# Patient Record
Sex: Female | Born: 1950 | Race: White | Hispanic: No | Marital: Married | Smoking: Current every day smoker
Health system: Southern US, Community
[De-identification: ages and names within clinical notes are randomized; demographics above are authoritative.]

## PROBLEM LIST (undated history)

## (undated) DIAGNOSIS — I10 Essential (primary) hypertension: Secondary | ICD-10-CM

## (undated) DIAGNOSIS — E7800 Pure hypercholesterolemia, unspecified: Secondary | ICD-10-CM

## (undated) DIAGNOSIS — A0472 Enterocolitis due to Clostridium difficile, not specified as recurrent: Secondary | ICD-10-CM

## (undated) DIAGNOSIS — C801 Malignant (primary) neoplasm, unspecified: Secondary | ICD-10-CM

## (undated) HISTORY — PX: ABDOMINAL HYSTERECTOMY: SHX81

## (undated) HISTORY — PX: DILATION AND CURETTAGE OF UTERUS: SHX78

## (undated) HISTORY — DX: Enterocolitis due to Clostridium difficile, not specified as recurrent: A04.72

## (undated) HISTORY — PX: CHOLECYSTECTOMY: SHX55

---

## 2007-10-28 ENCOUNTER — Emergency Department (HOSPITAL_COMMUNITY): Admission: EM | Admit: 2007-10-28 | Discharge: 2007-10-28 | Payer: Self-pay | Admitting: Emergency Medicine

## 2011-04-30 LAB — DIFFERENTIAL
Basophils Relative: 0
Eosinophils Absolute: 0.1
Lymphocytes Relative: 15
Monocytes Relative: 5
Neutro Abs: 9.5 — ABNORMAL HIGH
Neutrophils Relative %: 80 — ABNORMAL HIGH

## 2011-04-30 LAB — COMPREHENSIVE METABOLIC PANEL
ALT: 18
Alkaline Phosphatase: 69
CO2: 25
Chloride: 103
GFR calc non Af Amer: >60
Glucose, Bld: 110 — ABNORMAL HIGH
Potassium: 3.9
Sodium: 136
Total Bilirubin: 0.7
Total Protein: 6.6

## 2011-04-30 LAB — URINALYSIS, ROUTINE W REFLEX MICROSCOPIC
Glucose, UA: NEGATIVE
Nitrite: NEGATIVE
Specific Gravity, Urine: 1.01
pH: 7

## 2011-04-30 LAB — CBC
Hemoglobin: 13.6
RBC: 4.11
WBC: 11.8 — ABNORMAL HIGH

## 2012-10-14 ENCOUNTER — Encounter (HOSPITAL_COMMUNITY): Payer: Self-pay | Admitting: Emergency Medicine

## 2012-10-14 ENCOUNTER — Emergency Department (HOSPITAL_COMMUNITY)
Admission: EM | Admit: 2012-10-14 | Discharge: 2012-10-14 | Disposition: A | Discharge status: Home / Self Care | Payer: Commercial Managed Care - PPO | Attending: Emergency Medicine | Admitting: Emergency Medicine

## 2012-10-14 DIAGNOSIS — K649 Unspecified hemorrhoids: Secondary | ICD-10-CM | POA: Insufficient documentation

## 2012-10-14 DIAGNOSIS — F17200 Nicotine dependence, unspecified, uncomplicated: Secondary | ICD-10-CM | POA: Insufficient documentation

## 2012-10-14 DIAGNOSIS — Z862 Personal history of diseases of the blood and blood-forming organs and certain disorders involving the immune mechanism: Secondary | ICD-10-CM | POA: Insufficient documentation

## 2012-10-14 DIAGNOSIS — Z79899 Other long term (current) drug therapy: Secondary | ICD-10-CM | POA: Insufficient documentation

## 2012-10-14 DIAGNOSIS — I10 Essential (primary) hypertension: Secondary | ICD-10-CM | POA: Insufficient documentation

## 2012-10-14 DIAGNOSIS — K5900 Constipation, unspecified: Secondary | ICD-10-CM | POA: Insufficient documentation

## 2012-10-14 DIAGNOSIS — K921 Melena: Secondary | ICD-10-CM | POA: Insufficient documentation

## 2012-10-14 DIAGNOSIS — Z8639 Personal history of other endocrine, nutritional and metabolic disease: Secondary | ICD-10-CM | POA: Insufficient documentation

## 2012-10-14 DIAGNOSIS — K625 Hemorrhage of anus and rectum: Secondary | ICD-10-CM | POA: Insufficient documentation

## 2012-10-14 HISTORY — DX: Pure hypercholesterolemia, unspecified: E78.00

## 2012-10-14 HISTORY — DX: Essential (primary) hypertension: I10

## 2012-10-14 LAB — CBC WITH DIFFERENTIAL/PLATELET
Basophils Absolute: 0 10*3/uL (ref 0.0–0.1)
Basophils Relative: 1 % (ref 0–1)
Hemoglobin: 14.4 g/dL (ref 12.0–15.0)
MCHC: 34.4 g/dL (ref 30.0–36.0)
Neutro Abs: 5.7 10*3/uL (ref 1.7–7.7)
Neutrophils Relative %: 76 % (ref 43–77)
RDW: 13.3 % (ref 11.5–15.5)

## 2012-10-14 LAB — BASIC METABOLIC PANEL
BUN: 5 mg/dL — ABNORMAL LOW (ref 6–23)
GFR calc Af Amer: >90 mL/min (ref >90)
GFR calc non Af Amer: >90 mL/min (ref >90)
Potassium: 3.6 mEq/L (ref 3.5–5.1)

## 2012-10-14 LAB — OCCULT BLOOD, POC DEVICE: Fecal Occult Bld: POSITIVE — AB

## 2012-10-14 MED ORDER — POLYETHYLENE GLYCOL 3350 17 G PO PACK: 17.0000 g | PACK | Freq: Every day | ORAL | Status: DC

## 2012-10-14 MED ORDER — HYDROCORTISONE 2.5 % RE CREA: TOPICAL_CREAM | RECTAL | Status: DC

## 2012-10-14 NOTE — ED Provider Notes (Signed)
History  This chart was scribed for Joya Gaskins, MD by Bennett Scrape, ED Scribe. This patient was seen in room APA18/[facility]18 and the patient's care was started at 10:34 AM.  CSN: [id number]  Arrival date & time 10/14/12  1026   First MD Initiated Contact with Patient 10/14/12 1034      Chief Complaint  Patient presents with  . Rectal Bleeding     Patient is a 62 y.o. female presenting with hematochezia. The history is provided by the patient. No language interpreter was used.  Rectal Bleeding  The current episode started yesterday. The onset was sudden. The problem occurs continuously. The problem has been unchanged. The patient is experiencing no pain. Pertinent negatives include no fever, no abdominal pain, no nausea, no vomiting, no vaginal bleeding and no coughing.    Sierra Roberts is a 62 y.o. female who presents to the Emergency Department complaining of sudden onset, non-changing, constant bright red rectal bleeding that started yesterday during a BM. Pt reports that her BMs go from watery to constipated. Her last BMs was yesterday and she denies excessive straining. She reports prior episodes contributed to hemorrhoids. She denies having a h/o GI bleed and denies being on any anticoagulants currently. She denies following up with a GI currently. She denies cough, SOB, abdominal pain and emesis as associated symptoms. She has a h/o HTN which she takes daily medications for. She is a current everyday smoker and occasional alcohol user.  PCP is with [facility].  Past Medical History  Diagnosis Date  . Hypertension   . High cholesterol     Past Surgical History  Procedure Laterality Date  . Cholecystectomy      No family history on file.  History  Substance Use Topics  . Smoking status: Current Every Day Smoker -- 0.50 packs/day    Types: Cigarettes  . Smokeless tobacco: Not on file  . Alcohol Use: 1.2 oz/week    2 Glasses of wine per week     Comment: daily     No OB history provided.  Review of Systems  Constitutional: Negative for fever and chills.  Respiratory: Negative for cough and shortness of breath.   Gastrointestinal: Positive for hematochezia and anal bleeding. Negative for nausea, vomiting and abdominal pain.  Genitourinary: Negative for vaginal bleeding.  Musculoskeletal: Negative for back pain.  All other systems reviewed and are negative.    Allergies  Clams  Home Medications  No current outpatient prescriptions on file.  Triage Vitals: BP 146/94  Pulse 89  Temp(Src) 98.3 F (36.8 C)  Resp 18  Ht 5\' 6"  (1.676 m)  Wt 203 lb (92.08 kg)  BMI 32.78 kg/m2  SpO2 97%  Physical Exam  Nursing note and vitals reviewed.  CONSTITUTIONAL: Well developed/well nourished HEAD: Normocephalic/atraumatic EYES: EOMI/PERRL ENMT: Mucous membranes moist NECK: supple no meningeal signs SPINE:entire spine nontender CV: S1/S2 noted, no murmurs/rubs/gallops noted LUNGS: Lungs are clear to auscultation bilaterally, no apparent distress ABDOMEN: soft, nontender, no rebound or guarding GU:no cva tenderness RECTAL: Small amount of gross blood present on exam, stool is normal in color, no melena, several hemorrhoids noted, chaperone present NEURO: Pt is awake/alert, moves all extremitiesx4 EXTREMITIES: pulses normal, full ROM SKIN: warm, color normal PSYCH: no abnormalities of mood noted  ED Course  Procedures (including critical care time)  DIAGNOSTIC STUDIES: Oxygen Saturation is 97% on room air, adequate by my interpretation.    COORDINATION OF CARE: 10:50 AM-Discussed treatment plan which includes CBC panel  with pt at bedside and pt agreed to plan.    12:35 PM Spoke to on call GI dr Jena Gauss Low suspicion for serious GI bleed, especially since presence of hemorrhoids, normal HGB and otherwise well Recommends miralax (for recent constipation) anusol and will see her in clinic this week  Pt reports she had BM while in the ED  and no blood noted She feels comfortable with plan.  We discussed strict return precautions  MDM  Nursing notes including past medical history and social history reviewed and considered in documentation Labs/vital reviewed and considered    I personally performed the services described in this documentation, which was scribed in my presence. The recorded information has been reviewed and is accurate.   Joya Gaskins, MD 10/14/12 [phone]

## 2012-10-14 NOTE — ED Notes (Signed)
POC occult blood completed with a postive result. EDP aware. No new orders at this time.

## 2012-10-14 NOTE — ED Notes (Signed)
Pt c/o bright red rectal bleeding since yesterday. States she has some hemorrhoids.  Denies dizziness.

## 2012-10-16 ENCOUNTER — Encounter: Payer: Self-pay | Admitting: Gastroenterology

## 2012-10-16 ENCOUNTER — Ambulatory Visit (INDEPENDENT_AMBULATORY_CARE_PROVIDER_SITE_OTHER): Payer: Commercial Managed Care - PPO | Admitting: Gastroenterology

## 2012-10-16 VITALS — BP 124/80 | HR 97 | Temp 97.5°F | Ht 66.0 in | Wt 201.8 lb

## 2012-10-16 DIAGNOSIS — K625 Hemorrhage of anus and rectum: Secondary | ICD-10-CM | POA: Insufficient documentation

## 2012-10-16 NOTE — Progress Notes (Signed)
Faxed to PCP

## 2012-10-16 NOTE — Assessment & Plan Note (Signed)
62 year old female with recent 24 hour episode of rectal bleeding, resolving spontaneously. Hgb stable several days ago when checked in the ED. She has never had a lower GI evaluation, and she is adamantly refusing further work-up at this time. She denies any changes in bowel habits, abdominal pain, or any upper GI symptoms. I have discussed in detail the need for colorectal cancer screening. She understands the rationale for evaluation, but she is again refusing any colonoscopy at this point. I discussed future signs and symptoms that would warrant further investigation. She is fully aware that there is no way we can 100% know that the recent incidence was or was not related to hemorrhoids. She understands this as well.   Follow-up prn.  Colon cancer handout provided.

## 2012-10-16 NOTE — Progress Notes (Signed)
Primary Care Physician:  Lenise Herald, PA-C Primary Gastroenterologist:  Dr. Jena Gauss   Chief Complaint  Patient presents with  . Follow-up    HPI:   Ms. Sierra Roberts is a pleasant 62 year old female who presents today at the request of the ED secondary to rectal bleeding. Her Hgb was normal at that time, and rectal bleeding resolved while in the ED.  History of hematochezia in the past, intermittently. Recent episode of 24 hours of rectal bleeding, ceased while in ED. Started on Anusol cream. Prescribed Miralax as well. Denies chronic constipation, but she does have episodes occasionally of feeling constipated. Will sometimes have bowel movements with large amounts of liquid, which is more often than constipation. Will have nickel-quarter sized chunks in water, once a day occasionally. No abdominal pain. No rectal discomfort but occasional itching. No FH of colon cancer. NO prior colonoscopy. Lost about 25 lbs in the last year and a half because she "can't eat like she did when she was 20". She has been trying to eat healthier due to medical issues to include hypertension and high cholesterol. No upper GI symptoms.   She is ADAMANTLY refusing a colonoscopy.   Past Medical History  Diagnosis Date  . Hypertension   . High cholesterol     Past Surgical History  Procedure Laterality Date  . Cholecystectomy      Current Outpatient Prescriptions  Medication Sig Dispense Refill  . acidophilus (RISAQUAD) CAPS Take 1 capsule by mouth daily.      Marland Kitchen lisinopril-hydrochlorothiazide (PRINZIDE,ZESTORETIC) 20-12.5 MG per tablet Take 1 tablet by mouth daily.      . Multiple Vitamins-Minerals (MULTIVITAMINS THER. W/MINERALS) TABS Take 1 tablet by mouth daily.      . polyethylene glycol (MIRALAX / GLYCOLAX) packet Take 17 g by mouth daily.  10 each  0  . hydrocortisone (ANUSOL-HC) 2.5 % rectal cream Apply rectally 2 times daily  30 g  0   No current facility-administered medications for this visit.     Allergies as of 10/16/2012 - Review Complete 10/16/2012  Allergen Reaction Noted  . Clams (shellfish allergy)  10/14/2012    Family History  Problem Relation Age of Onset  . Colon cancer Neg Hx     History   Social History  . Marital Status: Married    Spouse Name: N/A    Number of Children: 1  . Years of Education: N/A   Occupational History  . housewife    Social History Main Topics  . Smoking status: Current Every Day Smoker -- 0.50 packs/day    Types: Cigarettes  . Smokeless tobacco: Not on file     Comment: now down to less than 1/2 a pack  . Alcohol Use: 1.2 oz/week    2 Glasses of wine per week     Comment: daily  . Drug Use: No  . Sexually Active: Not on file   Other Topics Concern  . Not on file   Social History Narrative  . No narrative on file    Review of Systems: Gen: Denies any fever, chills, fatigue, weight loss, lack of appetite.  CV: Denies chest pain, heart palpitations, peripheral edema, syncope.  Resp: Denies shortness of breath at rest or with exertion. Denies wheezing or cough.  GI: SEE HPI GU : Denies urinary burning, urinary frequency, urinary hesitancy MS: Denies joint pain, muscle weakness, cramps, or limitation of movement.  Derm: Denies rash, itching, dry skin Psych: Denies depression, anxiety, memory loss, and confusion Heme: Denies  bruising, bleeding, and enlarged lymph nodes.  Physical Exam: BP 124/80  Pulse 97  Temp(Src) 97.5 F (36.4 C) (Oral)  Ht 5\' 6"  (1.676 m)  Wt 201 lb 12.8 oz (91.536 kg)  BMI 32.59 kg/m2 General:   Alert and oriented. Pleasant and cooperative. Well-nourished and well-developed.  Head:  Normocephalic and atraumatic. Eyes:  Without icterus, sclera clear and conjunctiva pink.  Ears:  Normal auditory acuity. Nose:  No deformity, discharge,  or lesions. Mouth:  No deformity or lesions, oral mucosa pink.  Neck:  Supple, without mass or thyromegaly. Lungs:  Clear to auscultation bilaterally. No  wheezes, rales, or rhonchi. No distress.  Heart:  S1, S2 present without murmurs appreciated.  Abdomen:  +BS, soft, non-tender and non-distended. No HSM noted. No guarding or rebound. No masses appreciated.  Rectal: REFUSED Msk:  Symmetrical without gross deformities. Normal posture. Pulses:  Normal pulses noted. Extremities:  Without clubbing or edema. Neurologic:  Alert and  oriented x4;  grossly normal neurologically. Skin:  Intact without significant lesions or rashes. Cervical Nodes:  No significant cervical adenopathy. Psych:  Alert and cooperative. Normal mood and affect.

## 2012-10-16 NOTE — Patient Instructions (Addendum)
Continue to eat a high fiber diet.  Monitor for any signs of rectal bleeding, change in bowel habits, abdominal pain, unintentional weight loss, change in caliber of stools.  We recommend a colonoscopy, so please let us know if you are willing to proceed in the future.  Otherwise, we will see you back as needed.   Colorectal Cancer Colorectal cancer is an abnormal growth of tissue (tumor) in the colon or rectum that is cancerous (malignant). Unlike noncancerous (benign) tumors, malignant tumors can spread to other parts of your body. The colon is the large bowel or large intestine. The rectum is the last several inches of the colon. CAUSES  The exact cause of colon cancer is unknown.  RISK FACTORS The majority of patients do not have identifiable risk factors, but the following factors may increase your chances of getting colon cancer:  Age. Most colorectal cancers occur in people older than 50 years.  Having abnormal growths (polyps) on the inner wall of the colon or rectum.  Diabetes.  Being African American.  Family history of hereditary nonpolyposis colon cancer. This condition is caused by changes in the genes that are responsible for repairing mismatched DNA.  Family history of familial adenomatous polyposis (FAP). This is a rare, inherited condition in which hundreds of polyps form in the colon and rectum. It is caused by a change in the APC gene. Unless FAP is treated, it usually leads to colorectal cancer by age 13.  Personal history of cancer. A person who has already had colorectal cancer may develop it a second time. Also, women with a history of ovarian, uterine, or breast cancer are at a somewhat higher risk of developing colorectal cancer.  Inflammatory bowel disease, including ulcerative colitis and Crohn's disease.  Being obese or eating a diet that is high in fat (especially animal fat) and low in fiber, fruits, and vegetables.  Smoking. A person who smokes  cigarettes may be at increased risk of developing polyps and colorectal cancer.  Heavy alcohol use. SYMPTOMS Early colorectal cancer often does not cause symptoms. As the cancer grows, symptoms may include:  Diarrhea.  Constipation.  Feeling like the bowel does not empty completely after a bowel movement.  Blood in the stool.  Stools that are narrower than usual.  Abdominal discomfort, pain, bloating, fullness, or cramps.  Unexplained weight loss.  Constant tiredness.  Nausea and vomiting. DIAGNOSIS  Your caregiver will ask about your medical history. He or she may also perform a number of procedures, such as:  A physical exam.  Blood tests. This may include a routine complete blood count and iron level testing. Your caregiver may also check for carcinoembryonic antigen (CEA) and other substances in the blood. Some people who have colorectal cancer have a high CEA level. These levels may be used to follow the activity of your colon cancer.  Chest X-rays, computed tomography (CT) scans, or magnetic resonance imaging (MRI).  Taking a tissue sample (biopsy) from the colon or rectum. The sample is examined under a microscope to look for cancer cells.  Sigmoidoscopy. With this test, your caregiver can see inside your colon. A thin flexible tube (sigmoidoscope) is placed into your rectum. This device has a light source and a tiny video camera in it. Your caregiver uses the sigmoidoscope to look at the last third of your colon.  Colonoscopy. This test is like sigmoidoscopy, but your caregiver looks at the entire colon. This test usually requires medicine that helps you relax (sedative).  Endorectal ultrasound. With this test, your caregiver can see how deep a rectal tumor has grown and whether the cancer has spread to lymph nodes or other nearby tissues. A tool (probe) is inserted into the rectum. The probe sends out sound waves to the rectum and nearby tissues, and a computer uses the  echoes to create a picture. Your cancer will be staged to determine its severity and extent. Staging is a careful attempt to find out the size of the tumor, whether the cancer has spread, and if so, to what parts of the body. You may need to have more tests to determine the stage of your cancer. The test results will help determine what treatment plan is best for you. STAGES  Stage 0. The cancer is found only in the innermost lining of the colon or rectum.  Stage I. The cancer has grown into the inner wall of the colon or rectum. The cancer has not yet reached the outer wall of the colon.  Stage II. The cancer extends more deeply into or through the wall of the colon or rectum. It may have invaded nearby tissue, but cancer cells have not spread to the lymph nodes.  Stage III. The cancer has spread to nearby lymph nodes but not to other parts of the body.  Stage IV. The cancer has spread to other parts of the body, such as the liver or lungs. Your caregiver may tell you the detailed stage of your cancer. In that case, the stage will include both a number and a letter. TREATMENT  Depending on the type and stage, colorectal cancer may be treated with surgery, radiation therapy, or chemotherapy. Some patients have a combination of these therapies.  Surgery may be done to remove the polyps from your colon. In early stages, your caregiver may be able to do this during a colonoscopy. In later stages, surgery may be done to remove part of your colon.  Radiation therapy uses high-energy rays to kill cancer cells. This is usually recommended for patients with rectal cancer.  Chemotherapy is the use of drugs to kill cancer cells. Caregivers also give chemotherapy to help reduce pain and other problems caused by colorectal cancer. This may be done even if the cancer is not curable. HOME CARE INSTRUCTIONS   Only take over-the-counter or prescription medicines for pain, discomfort, or fever as directed by  your caregiver.  Maintain a healthy diet.  Consider joining a support group. This may help you learn to cope with the stress of having colorectal cancer.  Seek advice to help you manage treatment side effects.  Keep all follow-up appointments as directed by your caregiver.  Inform your cancer specialist if you are admitted to the hospital. SEEK IMMEDIATE MEDICAL CARE IF:   Your diarrhea or constipation does not go away.  You have alternating constipation and diarrhea.  You have blood in your stools.  Your abdominal pain gets worse.  You lose weight without trying.  You notice new fatigue or weakness.  You develop a fever during chemotherapy treatment. Document Released: 07/23/2005 Document Revised: 10/15/2011 Document Reviewed: 07/10/2011 [facility] Patient Information 2013 [place], [place].

## 2012-11-06 ENCOUNTER — Telehealth: Payer: Self-pay

## 2012-11-06 NOTE — Telephone Encounter (Signed)
Let's go ahead and get a GI pathogen stool report on her.   Have her follow-up with me next available in next 1-2 weeks(not urgent, this will give [place] time to get the labs back). Low-residue diet for now. She refused a colonoscopy at last visit. Hopefully, she will decide to proceed and may need an EGD if she continues to lose weight, +loss of appetite.

## 2012-11-06 NOTE — Telephone Encounter (Signed)
Pt called and said that she was having problems with loss of appetite. Loose stools x 3 days. ( She asked if Danley Danker had sent referral for a colonoscopy, she saw him yesterday). We had not received referral. But I told her since she was seen here by Gerrit Halls, NP on 10/16/2012. I will ask her for recommendations. She can be reached at [phone]. ( She said she did have a little chicken rice soup yesterday. Then she felt rotten today. Please advise!

## 2012-11-07 NOTE — Telephone Encounter (Signed)
Called and informed pt. She will come to the office to pick up specimen bottle and diet.

## 2012-11-17 ENCOUNTER — Telehealth: Payer: Self-pay | Admitting: Gastroenterology

## 2012-11-17 MED ORDER — METRONIDAZOLE 500 MG PO TABS: 500.0000 mg | ORAL_TABLET | Freq: Three times a day (TID) | ORAL | Status: DC

## 2012-11-17 NOTE — Telephone Encounter (Signed)
Pt aware, she has not been on any recent abx or around anyone that is sick that she is aware of. Pt is scheduled for ov on Wednesday of this week, per AS she doesn't need this appt and we need to be rescheduled for 2 weeks. Darl Pikes to reschedule ov appt.

## 2012-11-17 NOTE — Telephone Encounter (Signed)
I received GI pathogen stool studies back.  Appears positive for Cdiff.   Has she had any exposure to antibiotics recently? Any sick contacts? I have sent in Flagyl. Needs to take total of 14 days. Follow-up with [place] after completion of antibiotics for reassessment. Ultimately needs colonoscopy after current issues resolve. Continue probiotic she is taking.

## 2012-11-19 ENCOUNTER — Ambulatory Visit: Payer: Commercial Managed Care - PPO | Admitting: Gastroenterology

## 2012-11-26 ENCOUNTER — Other Ambulatory Visit: Payer: Self-pay

## 2012-11-26 ENCOUNTER — Telehealth: Payer: Self-pay | Admitting: General Practice

## 2012-11-26 ENCOUNTER — Other Ambulatory Visit: Payer: Self-pay | Admitting: Gastroenterology

## 2012-11-26 DIAGNOSIS — R197 Diarrhea, unspecified: Secondary | ICD-10-CM

## 2012-11-26 NOTE — Telephone Encounter (Signed)
Spoke with pt- she started having tingling in her hand and foot today, no SOB, no rashes, no dizzines. Pt has been taking abx since 4/14 and her diarrhea doesn't seem to be getting any better. She still has around 2 bowel movements daily and they are very loose and runny. She doesn't think the abx are working and stated she wasn't taking anymore of this abx. Please advise.

## 2012-11-26 NOTE — Telephone Encounter (Signed)
This pleasant lady is aware of her OV on 4/30 and she has an appt card.

## 2012-11-26 NOTE — Telephone Encounter (Signed)
Pt aware. Stool container at the front desk. Pt is agreeable to ov to set up tcs.  Darl Pikes, please schedule.

## 2012-11-26 NOTE — Telephone Encounter (Signed)
Patient called and stated she is having tingling in her left foot/left hand.  She started having the tingling once she started the antibiotic. She read on the [facility] web site that if you start having these symptoms to stop taking this medication and call your doctor immediatly.  Please advise?

## 2012-11-26 NOTE — Telephone Encounter (Signed)
Recheck Cdiff.   Unclear if this symptom is related to Flagyl or not. Needs to come in for OV to set up TCS.

## 2012-12-03 ENCOUNTER — Ambulatory Visit (INDEPENDENT_AMBULATORY_CARE_PROVIDER_SITE_OTHER): Payer: Commercial Managed Care - PPO | Admitting: Gastroenterology

## 2012-12-03 ENCOUNTER — Telehealth: Payer: Self-pay | Admitting: Internal Medicine

## 2012-12-03 ENCOUNTER — Encounter: Payer: Self-pay | Admitting: Gastroenterology

## 2012-12-03 VITALS — BP 141/87 | HR 87 | Temp 98.2°F | Ht 66.0 in | Wt 196.2 lb

## 2012-12-03 DIAGNOSIS — A0472 Enterocolitis due to Clostridium difficile, not specified as recurrent: Secondary | ICD-10-CM

## 2012-12-03 DIAGNOSIS — Z1211 Encounter for screening for malignant neoplasm of colon: Secondary | ICD-10-CM

## 2012-12-03 LAB — CLOSTRIDIUM DIFFICILE BY PCR: Toxigenic C. Difficile by PCR: NOT DETECTED

## 2012-12-03 MED ORDER — PEG 3350-KCL-NA BICARB-NACL 420 G PO SOLR: 4000.0000 mL | ORAL | Status: DC

## 2012-12-03 NOTE — Progress Notes (Signed)
Referring Provider: Mann, Benjamin, PA-C Primary Care Physician:  MANN, BENJAMIN, PA-C Primary Gastroenterologist: Dr. Rourk   Chief Complaint  Patient presents with  . Follow-up    HPI:   62-year-old female presentss today, previously seen by myself in March 2014 due to rectal bleeding with normal Hgb. She refused colonoscopy at that time. She called in early April with loose stools, and Cdiff performed was POSITIVE mid April. Started on Flagyl.  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 Now notes 1 loose/runny BM per day; she will also have occasional constipation, which causes significantly decreased appetite, bloating, and unable to eat until constipation resolved. No rectal bleeding.   She has lost 7 lbs since March 2014.   Past Medical History  Diagnosis Date  . Hypertension   . High cholesterol   . C. difficile diarrhea     Past Surgical History  Procedure Laterality Date  . Cholecystectomy      Current Outpatient Prescriptions  Medication Sig Dispense Refill  . acidophilus (RISAQUAD) CAPS Take 1 capsule by mouth daily.      . hydrocortisone (ANUSOL-HC) 2.5 % rectal cream Apply rectally 2 times daily  30 g  0  . lisinopril-hydrochlorothiazide (PRINZIDE,ZESTORETIC) 20-12.5 MG per tablet Take 1 tablet by mouth daily.      . Multiple Vitamins-Minerals (MULTIVITAMINS THER. W/MINERALS) TABS Take 1 tablet by mouth daily.      . polyethylene glycol (MIRALAX / GLYCOLAX) packet Take 17 g by mouth daily.  10 each  0  . metroNIDAZOLE (FLAGYL) 500 MG tablet Take 1 tablet (500 mg total) by mouth 3 (three) times daily.  42 tablet  0  . polyethylene glycol-electrolytes (TRILYTE) 420 G solution Take 4,000 mLs by mouth as directed.  4000 mL  0   No current facility-administered medications for this visit.    Allergies as of 12/03/2012 - Review Complete 12/03/2012  Allergen Reaction Noted  . Clams (shellfish allergy)  10/14/2012  .  Flagyl (metronidazole) Other (See Comments) 12/03/2012    Family History  Problem Relation Age of Onset  . Colon cancer Neg Hx     History   Social History  . Marital Status: Married    Spouse Name: N/A    Number of Children: 1  . Years of Education: N/A   Occupational History  . housewife    Social History Main Topics  . Smoking status: Current Every Day Smoker -- 0.50 packs/day    Types: Cigarettes  . Smokeless tobacco: None     Comment: now down to less than 1/2 a pack  . Alcohol Use: 1.2 oz/week    2 Glasses of wine per week     Comment: daily  . Drug Use: No  . Sexually Active: None   Other Topics Concern  . None   Social History Narrative  . None    Review of Systems: Negative unless mentioned in HPI.   Physical Exam: BP 141/87  Pulse 87  Temp(Src) 98.2 F (36.8 C) (Oral)  Ht 5' 6" (1.676 m)  Wt 196 lb 3.2 oz (88.996 kg)  BMI 31.68 kg/m2 General:   Alert and oriented. No distress noted. Pleasant and cooperative.  Head:  Normocephalic and atraumatic. Eyes:  Conjuctiva clear without scleral icterus. Mouth:  Oral mucosa pink and moist. Good dentition. No lesions. Heart:  S1, S2 present without murmurs, rubs, or gallops. Regular rate and rhythm. Abdomen:  +BS, soft, non-tender and   non-distended. No rebound or guarding. No HSM or masses noted. Possible ventral hernia at RUQ incision Msk:  Symmetrical without gross deformities. Normal posture. Extremities:  Without edema. Neurologic:  Alert and  oriented x4;  grossly normal neurologically. Skin:  Intact without significant lesions or rashes. Psych:  Alert and cooperative. Normal mood and affect.  

## 2012-12-03 NOTE — Telephone Encounter (Signed)
DONE

## 2012-12-03 NOTE — Telephone Encounter (Signed)
Pt called to [facility] her TCS. I told her that I would have LAW call her back as soon as she gets in. [phone]

## 2012-12-03 NOTE — Patient Instructions (Addendum)
We have scheduled you for a colonoscopy with Dr. Jena Gauss in the near future.  If the Cdiff returns positive, we will need to postpone this. We will be in touch shortly.

## 2012-12-05 ENCOUNTER — Encounter: Payer: Self-pay | Admitting: Gastroenterology

## 2012-12-05 DIAGNOSIS — Z1211 Encounter for screening for malignant neoplasm of colon: Secondary | ICD-10-CM | POA: Insufficient documentation

## 2012-12-05 NOTE — Assessment & Plan Note (Signed)
62 year old female with no prior colonoscopy, originally referred in March 2014 due to limited rectal bleeding. She refused colonoscopy at that time, with subsequent development of diarrhea and +CDIFF through our office. She completed 9 days of Flagyl, after stopping due to possible neuropathy side effects. She notes intermittent constipation with associated bloating, loss of appetite. Otherwise, her bowel movements are normally loose/runny, once daily. It is concerning that she has lost 7 lbs, unintentionally, since March 2014. She is now agreeable to proceed with a colonoscopy and actually eager to do this.  Recheck Cdiff PCR now to ensure resolution Proceed with TCS with Dr. Jena Gauss in near future: the risks, benefits, and alternatives have been discussed with the patient in detail. The patient states understanding and desires to proceed.

## 2012-12-08 NOTE — Progress Notes (Signed)
Cc PCP 

## 2012-12-11 NOTE — Progress Notes (Signed)
Quick Note:  Negative Cdiff. Proceed with colonoscopy as planned. ______

## 2012-12-15 ENCOUNTER — Encounter (HOSPITAL_COMMUNITY): Payer: Self-pay | Admitting: Pharmacy Technician

## 2012-12-30 ENCOUNTER — Encounter (HOSPITAL_COMMUNITY)
Admission: RE | Disposition: A | Discharge status: Home / Self Care | Payer: Self-pay | Source: Ambulatory Visit | Attending: Internal Medicine

## 2012-12-30 ENCOUNTER — Encounter (HOSPITAL_COMMUNITY): Payer: Self-pay | Admitting: *Deleted

## 2012-12-30 ENCOUNTER — Ambulatory Visit (HOSPITAL_COMMUNITY)
Admission: RE | Admit: 2012-12-30 | Discharge: 2012-12-30 | Disposition: A | Discharge status: Home / Self Care | Payer: Commercial Managed Care - PPO | Source: Ambulatory Visit | Attending: Internal Medicine | Admitting: Internal Medicine

## 2012-12-30 DIAGNOSIS — A0472 Enterocolitis due to Clostridium difficile, not specified as recurrent: Secondary | ICD-10-CM

## 2012-12-30 DIAGNOSIS — D126 Benign neoplasm of colon, unspecified: Secondary | ICD-10-CM

## 2012-12-30 DIAGNOSIS — K5730 Diverticulosis of large intestine without perforation or abscess without bleeding: Secondary | ICD-10-CM | POA: Insufficient documentation

## 2012-12-30 DIAGNOSIS — I10 Essential (primary) hypertension: Secondary | ICD-10-CM | POA: Insufficient documentation

## 2012-12-30 DIAGNOSIS — K648 Other hemorrhoids: Secondary | ICD-10-CM

## 2012-12-30 DIAGNOSIS — K625 Hemorrhage of anus and rectum: Secondary | ICD-10-CM

## 2012-12-30 HISTORY — PX: COLONOSCOPY: SHX5424

## 2012-12-30 SURGERY — COLONOSCOPY
Anesthesia: Moderate Sedation

## 2012-12-30 MED ORDER — MIDAZOLAM HCL 5 MG/5ML IJ SOLN
INTRAMUSCULAR | Status: DC | PRN
  Administered 2012-12-30 (×3): 2 mg via INTRAVENOUS

## 2012-12-30 MED ORDER — MEPERIDINE HCL 100 MG/ML IJ SOLN
INTRAMUSCULAR | Status: AC
  Filled 2012-12-30: qty 1

## 2012-12-30 MED ORDER — STERILE WATER FOR IRRIGATION IR SOLN
Status: DC | PRN
  Administered 2012-12-30: 09:00:00

## 2012-12-30 MED ORDER — SODIUM CHLORIDE 0.9 % IV SOLN
INTRAVENOUS | Status: DC
  Administered 2012-12-30: 09:00:00 via INTRAVENOUS

## 2012-12-30 MED ORDER — MEPERIDINE HCL 100 MG/ML IJ SOLN
INTRAMUSCULAR | Status: DC | PRN
  Administered 2012-12-30: 50 mg via INTRAVENOUS
  Administered 2012-12-30: 25 mg via INTRAVENOUS
  Administered 2012-12-30: 50 mg via INTRAVENOUS

## 2012-12-30 MED ORDER — ONDANSETRON HCL 4 MG/2ML IJ SOLN
INTRAMUSCULAR | Status: AC
  Filled 2012-12-30: qty 2

## 2012-12-30 MED ORDER — MIDAZOLAM HCL 5 MG/5ML IJ SOLN
INTRAMUSCULAR | Status: AC
  Filled 2012-12-30: qty 10

## 2012-12-30 NOTE — Op Note (Signed)
[facility] [address] [place], [address]   COLONOSCOPY PROCEDURE REPORT  PATIENT: Sierra Roberts, Sierra Roberts  MR#:         [id number] BIRTHDATE: 1950/09/04 , 61  yrs. old GENDER: Female ENDOSCOPIST: R.  Roetta Sessions, MD FACP [facility] REFERRED BY:  Lenise Herald, PA-C PROCEDURE DATE:  12/30/2012 PROCEDURE:     Ileocolonoscopy with snare polypectomy  INDICATIONS: rectal bleeding; no prior colonoscopy  INFORMED CONSENT:  The risks, benefits, alternatives and imponderables including but not limited to bleeding, perforation as well as the possibility of a missed lesion have been reviewed.  The potential for biopsy, lesion removal, etc. have also been discussed.  Questions have been answered.  All parties agreeable. Please see the history and physical in the medical record for more information.  MEDICATIONS: Versed 6 mg IV and Demerol 125 mg in divided doses. Zofran 4 mg IV  DESCRIPTION OF PROCEDURE:  After a digital rectal exam was performed, the EC-[id number] ([id number])  colonoscope was advanced from the anus through the rectum and colon to the area of the cecum, ileocecal valve and appendiceal orifice.  The cecum was deeply intubated.  These structures were well-seen and photographed for the record.  From the level of the cecum and ileocecal valve, the scope was slowly and cautiously withdrawn.  The mucosal surfaces were carefully surveyed utilizing scope tip deflection to facilitate fold flattening as needed.  The scope was pulled down into the rectum where a thorough examination including retroflexion was performed.    FINDINGS:  Adequate preparation. Prominent internal hemorrhoids; otherwise, normal rectum mucosa. (1) 1.2 cm friable sessile polyp in the distal sigmoid at 25 cm of the anal verge. The patient also had pancolonic diverticulosis. Otherwise, the remainder of the colonic mucosa appeared normal. The distal 10 cm of terminal ileal mucosa appeared  normal.  THERAPEUTIC / DIAGNOSTIC MANEUVERS PERFORMED:  The above-mentioned polyp was removed completely with hot snare cautery  COMPLICATIONS: None  CECAL WITHDRAWAL TIME:  11 minutes  IMPRESSION:  Prominent internal hemorrhoids. Colonic polyp-removed as described above. Colonic diverticulosis.  RECOMMENDATIONS: Begin Benefiber 2 tablespoons daily. Course of Anusol suppositories. Followup on pathology.   _______________________________ eSigned:  R. Roetta Sessions, MD FACP [facility] 12/30/2012 10:00 AM   CC:    PATIENT NAME:  [name], [name] MR#: [id number]

## 2012-12-30 NOTE — Interval H&P Note (Signed)
History and Physical Interval Note:  12/30/2012 9:19 AM  Sierra Roberts  has presented today for surgery, with the diagnosis of C-DIFF  The various methods of treatment have been discussed with the patient and family. After consideration of risks, benefits and other options for treatment, the patient has consented to  Procedure(s) with comments: COLONOSCOPY (N/A) - 8:30-rescheduled to 9:30 Sierra Roberts notified pt as a surgical intervention .  The patient's history has been reviewed, patient examined, no change in status, stable for surgery.  I have reviewed the patient's chart and labs.  Questions were answered to the patient's satisfaction.     Sierra Roberts  Intermittent rectal bleeding. Colonoscopy today per plan.The risks, benefits, limitations, alternatives and imponderables have been reviewed with the patient. Questions have been answered. All parties are agreeable.

## 2012-12-30 NOTE — H&P (View-Only) (Signed)
Referring Provider: Lenise Herald, PA-C Primary Care Physician:  Lenise Herald, PA-C Primary Gastroenterologist: Dr. Jena Gauss   Chief Complaint  Patient presents with  . Follow-up    HPI:   62 year old female presents today, previously seen by myself in March 2014 due to rectal bleeding with normal Hgb. She refused colonoscopy at that time. She called in early April with loose stools, and Cdiff performed was POSITIVE mid April. Started on Flagyl.  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 Now notes 1 loose/runny BM per day; she will also have occasional constipation, which causes significantly decreased appetite, bloating, and unable to eat until constipation resolved. No rectal bleeding.   She has lost 7 lbs since March 2014.   Past Medical History  Diagnosis Date  . Hypertension   . High cholesterol   . C. difficile diarrhea     Past Surgical History  Procedure Laterality Date  . Cholecystectomy      Current Outpatient Prescriptions  Medication Sig Dispense Refill  . acidophilus (RISAQUAD) CAPS Take 1 capsule by mouth daily.      . hydrocortisone (ANUSOL-HC) 2.5 % rectal cream Apply rectally 2 times daily  30 g  0  . lisinopril-hydrochlorothiazide (PRINZIDE,ZESTORETIC) 20-12.5 MG per tablet Take 1 tablet by mouth daily.      . Multiple Vitamins-Minerals (MULTIVITAMINS THER. W/MINERALS) TABS Take 1 tablet by mouth daily.      . polyethylene glycol (MIRALAX / GLYCOLAX) packet Take 17 g by mouth daily.  10 each  0  . metroNIDAZOLE (FLAGYL) 500 MG tablet Take 1 tablet (500 mg total) by mouth 3 (three) times daily.  42 tablet  0  . polyethylene glycol-electrolytes (TRILYTE) 420 G solution Take 4,000 mLs by mouth as directed.  4000 mL  0   No current facility-administered medications for this visit.    Allergies as of 12/03/2012 - Review Complete 12/03/2012  Allergen Reaction Noted  . Clams (shellfish allergy)  10/14/2012  .  Flagyl (metronidazole) Other (See Comments) 12/03/2012    Family History  Problem Relation Age of Onset  . Colon cancer Neg Hx     History   Social History  . Marital Status: Married    Spouse Name: N/A    Number of Children: 1  . Years of Education: N/A   Occupational History  . housewife    Social History Main Topics  . Smoking status: Current Every Day Smoker -- 0.50 packs/day    Types: Cigarettes  . Smokeless tobacco: None     Comment: now down to less than 1/2 a pack  . Alcohol Use: 1.2 oz/week    2 Glasses of wine per week     Comment: daily  . Drug Use: No  . Sexually Active: None   Other Topics Concern  . None   Social History Narrative  . None    Review of Systems: Negative unless mentioned in HPI.   Physical Exam: BP 141/87  Pulse 87  Temp(Src) 98.2 F (36.8 C) (Oral)  Ht 5\' 6"  (1.676 m)  Wt 196 lb 3.2 oz (88.996 kg)  BMI 31.68 kg/m2 General:   Alert and oriented. No distress noted. Pleasant and cooperative.  Head:  Normocephalic and atraumatic. Eyes:  Conjuctiva clear without scleral icterus. Mouth:  Oral mucosa pink and moist. Good dentition. No lesions. Heart:  S1, S2 present without murmurs, rubs, or gallops. Regular rate and rhythm. Abdomen:  +BS, soft, non-tender and  non-distended. No rebound or guarding. No HSM or masses noted. Possible ventral hernia at RUQ incision Msk:  Symmetrical without gross deformities. Normal posture. Extremities:  Without edema. Neurologic:  Alert and  oriented x4;  grossly normal neurologically. Skin:  Intact without significant lesions or rashes. Psych:  Alert and cooperative. Normal mood and affect.

## 2013-01-01 ENCOUNTER — Encounter (HOSPITAL_COMMUNITY): Payer: Self-pay | Admitting: Internal Medicine

## 2013-01-01 ENCOUNTER — Encounter: Payer: Self-pay | Admitting: Internal Medicine

## 2015-11-22 ENCOUNTER — Encounter: Payer: Self-pay | Admitting: Internal Medicine

## 2016-03-19 DIAGNOSIS — Z6836 Body mass index (BMI) 36.0-36.9, adult: Secondary | ICD-10-CM | POA: Diagnosis not present

## 2016-03-19 DIAGNOSIS — E782 Mixed hyperlipidemia: Secondary | ICD-10-CM | POA: Diagnosis not present

## 2016-03-19 DIAGNOSIS — I10 Essential (primary) hypertension: Secondary | ICD-10-CM | POA: Diagnosis not present

## 2016-06-19 DIAGNOSIS — Z6836 Body mass index (BMI) 36.0-36.9, adult: Secondary | ICD-10-CM | POA: Diagnosis not present

## 2016-06-19 DIAGNOSIS — E6609 Other obesity due to excess calories: Secondary | ICD-10-CM | POA: Diagnosis not present

## 2016-06-19 DIAGNOSIS — I10 Essential (primary) hypertension: Secondary | ICD-10-CM | POA: Diagnosis not present

## 2016-06-19 DIAGNOSIS — Z1389 Encounter for screening for other disorder: Secondary | ICD-10-CM | POA: Diagnosis not present

## 2016-06-19 DIAGNOSIS — E782 Mixed hyperlipidemia: Secondary | ICD-10-CM | POA: Diagnosis not present

## 2016-06-19 DIAGNOSIS — Z23 Encounter for immunization: Secondary | ICD-10-CM | POA: Diagnosis not present

## 2017-03-11 DIAGNOSIS — Z1389 Encounter for screening for other disorder: Secondary | ICD-10-CM | POA: Diagnosis not present

## 2017-03-11 DIAGNOSIS — Z6838 Body mass index (BMI) 38.0-38.9, adult: Secondary | ICD-10-CM | POA: Diagnosis not present

## 2017-03-11 DIAGNOSIS — E669 Obesity, unspecified: Secondary | ICD-10-CM | POA: Diagnosis not present

## 2017-03-11 DIAGNOSIS — I10 Essential (primary) hypertension: Secondary | ICD-10-CM | POA: Diagnosis not present

## 2017-12-25 DIAGNOSIS — E6609 Other obesity due to excess calories: Secondary | ICD-10-CM | POA: Diagnosis not present

## 2017-12-25 DIAGNOSIS — Z0000 Encounter for general adult medical examination without abnormal findings: Secondary | ICD-10-CM | POA: Diagnosis not present

## 2017-12-25 DIAGNOSIS — I10 Essential (primary) hypertension: Secondary | ICD-10-CM | POA: Diagnosis not present

## 2017-12-25 DIAGNOSIS — Z6836 Body mass index (BMI) 36.0-36.9, adult: Secondary | ICD-10-CM | POA: Diagnosis not present

## 2017-12-25 DIAGNOSIS — E782 Mixed hyperlipidemia: Secondary | ICD-10-CM | POA: Diagnosis not present

## 2018-08-19 ENCOUNTER — Emergency Department (HOSPITAL_COMMUNITY): Payer: Medicare Other

## 2018-08-19 ENCOUNTER — Encounter (HOSPITAL_COMMUNITY): Payer: Self-pay

## 2018-08-19 ENCOUNTER — Other Ambulatory Visit: Payer: Self-pay

## 2018-08-19 ENCOUNTER — Emergency Department (HOSPITAL_COMMUNITY)
Admission: EM | Admit: 2018-08-19 | Discharge: 2018-08-19 | Disposition: A | Discharge status: Other Acute Inpatient Hospital | Payer: Medicare Other | Attending: Emergency Medicine | Admitting: Emergency Medicine

## 2018-08-19 DIAGNOSIS — F17210 Nicotine dependence, cigarettes, uncomplicated: Secondary | ICD-10-CM | POA: Insufficient documentation

## 2018-08-19 DIAGNOSIS — R1900 Intra-abdominal and pelvic swelling, mass and lump, unspecified site: Secondary | ICD-10-CM | POA: Diagnosis not present

## 2018-08-19 DIAGNOSIS — R922 Inconclusive mammogram: Secondary | ICD-10-CM | POA: Diagnosis not present

## 2018-08-19 DIAGNOSIS — E6609 Other obesity due to excess calories: Secondary | ICD-10-CM | POA: Diagnosis not present

## 2018-08-19 DIAGNOSIS — J90 Pleural effusion, not elsewhere classified: Secondary | ICD-10-CM | POA: Diagnosis not present

## 2018-08-19 DIAGNOSIS — C7989 Secondary malignant neoplasm of other specified sites: Secondary | ICD-10-CM | POA: Diagnosis present

## 2018-08-19 DIAGNOSIS — C771 Secondary and unspecified malignant neoplasm of intrathoracic lymph nodes: Secondary | ICD-10-CM | POA: Diagnosis present

## 2018-08-19 DIAGNOSIS — R112 Nausea with vomiting, unspecified: Secondary | ICD-10-CM | POA: Diagnosis not present

## 2018-08-19 DIAGNOSIS — N858 Other specified noninflammatory disorders of uterus: Secondary | ICD-10-CM | POA: Diagnosis not present

## 2018-08-19 DIAGNOSIS — C773 Secondary and unspecified malignant neoplasm of axilla and upper limb lymph nodes: Secondary | ICD-10-CM | POA: Diagnosis present

## 2018-08-19 DIAGNOSIS — Z1389 Encounter for screening for other disorder: Secondary | ICD-10-CM | POA: Diagnosis not present

## 2018-08-19 DIAGNOSIS — R231 Pallor: Secondary | ICD-10-CM | POA: Diagnosis not present

## 2018-08-19 DIAGNOSIS — I10 Essential (primary) hypertension: Secondary | ICD-10-CM | POA: Diagnosis present

## 2018-08-19 DIAGNOSIS — J910 Malignant pleural effusion: Secondary | ICD-10-CM | POA: Diagnosis present

## 2018-08-19 DIAGNOSIS — N83201 Unspecified ovarian cyst, right side: Secondary | ICD-10-CM | POA: Diagnosis not present

## 2018-08-19 DIAGNOSIS — R000 Tachycardia, unspecified: Secondary | ICD-10-CM | POA: Diagnosis not present

## 2018-08-19 DIAGNOSIS — N289 Disorder of kidney and ureter, unspecified: Secondary | ICD-10-CM | POA: Diagnosis not present

## 2018-08-19 DIAGNOSIS — N179 Acute kidney failure, unspecified: Secondary | ICD-10-CM | POA: Diagnosis not present

## 2018-08-19 DIAGNOSIS — E7800 Pure hypercholesterolemia, unspecified: Secondary | ICD-10-CM | POA: Diagnosis not present

## 2018-08-19 DIAGNOSIS — K5732 Diverticulitis of large intestine without perforation or abscess without bleeding: Secondary | ICD-10-CM | POA: Diagnosis not present

## 2018-08-19 DIAGNOSIS — Z6832 Body mass index (BMI) 32.0-32.9, adult: Secondary | ICD-10-CM | POA: Diagnosis not present

## 2018-08-19 DIAGNOSIS — B353 Tinea pedis: Secondary | ICD-10-CM | POA: Diagnosis present

## 2018-08-19 DIAGNOSIS — L539 Erythematous condition, unspecified: Secondary | ICD-10-CM | POA: Diagnosis not present

## 2018-08-19 DIAGNOSIS — Z79899 Other long term (current) drug therapy: Secondary | ICD-10-CM | POA: Insufficient documentation

## 2018-08-19 DIAGNOSIS — R918 Other nonspecific abnormal finding of lung field: Secondary | ICD-10-CM | POA: Diagnosis not present

## 2018-08-19 DIAGNOSIS — E785 Hyperlipidemia, unspecified: Secondary | ICD-10-CM | POA: Diagnosis present

## 2018-08-19 DIAGNOSIS — N6323 Unspecified lump in the left breast, lower outer quadrant: Secondary | ICD-10-CM | POA: Diagnosis not present

## 2018-08-19 DIAGNOSIS — I959 Hypotension, unspecified: Secondary | ICD-10-CM | POA: Diagnosis not present

## 2018-08-19 DIAGNOSIS — N859 Noninflammatory disorder of uterus, unspecified: Secondary | ICD-10-CM | POA: Diagnosis not present

## 2018-08-19 DIAGNOSIS — C50919 Malignant neoplasm of unspecified site of unspecified female breast: Secondary | ICD-10-CM | POA: Diagnosis not present

## 2018-08-19 DIAGNOSIS — R590 Localized enlarged lymph nodes: Secondary | ICD-10-CM | POA: Diagnosis not present

## 2018-08-19 DIAGNOSIS — R188 Other ascites: Secondary | ICD-10-CM | POA: Diagnosis not present

## 2018-08-19 DIAGNOSIS — D0510 Intraductal carcinoma in situ of unspecified breast: Secondary | ICD-10-CM | POA: Diagnosis not present

## 2018-08-19 DIAGNOSIS — J9811 Atelectasis: Secondary | ICD-10-CM | POA: Diagnosis not present

## 2018-08-19 DIAGNOSIS — J9819 Other pulmonary collapse: Secondary | ICD-10-CM | POA: Diagnosis present

## 2018-08-19 DIAGNOSIS — R269 Unspecified abnormalities of gait and mobility: Secondary | ICD-10-CM | POA: Diagnosis not present

## 2018-08-19 DIAGNOSIS — K529 Noninfective gastroenteritis and colitis, unspecified: Secondary | ICD-10-CM | POA: Diagnosis present

## 2018-08-19 DIAGNOSIS — J918 Pleural effusion in other conditions classified elsewhere: Secondary | ICD-10-CM | POA: Diagnosis not present

## 2018-08-19 DIAGNOSIS — Z9049 Acquired absence of other specified parts of digestive tract: Secondary | ICD-10-CM | POA: Diagnosis not present

## 2018-08-19 DIAGNOSIS — N644 Mastodynia: Secondary | ICD-10-CM | POA: Diagnosis not present

## 2018-08-19 DIAGNOSIS — Z9889 Other specified postprocedural states: Secondary | ICD-10-CM | POA: Diagnosis not present

## 2018-08-19 DIAGNOSIS — C50912 Malignant neoplasm of unspecified site of left female breast: Secondary | ICD-10-CM | POA: Diagnosis present

## 2018-08-19 DIAGNOSIS — C50512 Malignant neoplasm of lower-outer quadrant of left female breast: Secondary | ICD-10-CM | POA: Diagnosis not present

## 2018-08-19 DIAGNOSIS — R109 Unspecified abdominal pain: Secondary | ICD-10-CM | POA: Diagnosis not present

## 2018-08-19 DIAGNOSIS — Z8619 Personal history of other infectious and parasitic diseases: Secondary | ICD-10-CM | POA: Diagnosis not present

## 2018-08-19 DIAGNOSIS — Z881 Allergy status to other antibiotic agents status: Secondary | ICD-10-CM | POA: Diagnosis not present

## 2018-08-19 LAB — COMPREHENSIVE METABOLIC PANEL
ALK PHOS: 44 U/L (ref 38–126)
ALT: 12 U/L (ref 0–44)
AST: 21 U/L (ref 15–41)
Albumin: 3.9 g/dL (ref 3.5–5.0)
Anion gap: 11 (ref 5–15)
BUN: 31 mg/dL — ABNORMAL HIGH (ref 8–23)
CALCIUM: 9.2 mg/dL (ref 8.9–10.3)
CO2: 22 mmol/L (ref 22–32)
Chloride: 103 mmol/L (ref 98–111)
Creatinine, Ser: 1.16 mg/dL — ABNORMAL HIGH (ref 0.44–1.00)
GFR calc Af Amer: 56 mL/min — ABNORMAL LOW (ref >60)
GFR calc non Af Amer: 49 mL/min — ABNORMAL LOW (ref >60)
Glucose, Bld: 124 mg/dL — ABNORMAL HIGH (ref 70–99)
Potassium: 3.8 mmol/L (ref 3.5–5.1)
Sodium: 136 mmol/L (ref 135–145)
Total Bilirubin: 1.1 mg/dL (ref 0.3–1.2)
Total Protein: 7.6 g/dL (ref 6.5–8.1)

## 2018-08-19 LAB — URINALYSIS, ROUTINE W REFLEX MICROSCOPIC
Bilirubin Urine: NEGATIVE
Glucose, UA: NEGATIVE mg/dL
Hgb urine dipstick: NEGATIVE
KETONES UR: NEGATIVE mg/dL
LEUKOCYTES UA: NEGATIVE
Nitrite: NEGATIVE
Protein, ur: NEGATIVE mg/dL
Specific Gravity, Urine: >1.046 — ABNORMAL HIGH (ref 1.005–1.030)
pH: 5 (ref 5.0–8.0)

## 2018-08-19 LAB — CBC WITH DIFFERENTIAL/PLATELET
Abs Immature Granulocytes: 0.39 10*3/uL — ABNORMAL HIGH (ref 0.00–0.07)
Basophils Absolute: 0.1 10*3/uL (ref 0.0–0.1)
Basophils Relative: 1 %
Eosinophils Absolute: 0.1 10*3/uL (ref 0.0–0.5)
Eosinophils Relative: 1 %
HCT: 42.4 % (ref 36.0–46.0)
Hemoglobin: 13.9 g/dL (ref 12.0–15.0)
Immature Granulocytes: 3 %
Lymphocytes Relative: 12 %
Lymphs Abs: 1.6 10*3/uL (ref 0.7–4.0)
MCH: 29.7 pg (ref 26.0–34.0)
MCHC: 32.8 g/dL (ref 30.0–36.0)
MCV: 90.6 fL (ref 80.0–100.0)
Monocytes Absolute: 0.9 10*3/uL (ref 0.1–1.0)
Monocytes Relative: 7 %
Neutro Abs: 10.4 10*3/uL — ABNORMAL HIGH (ref 1.7–7.7)
Neutrophils Relative %: 76 %
Platelets: 472 10*3/uL — ABNORMAL HIGH (ref 150–400)
RBC: 4.68 MIL/uL (ref 3.87–5.11)
RDW: 13.3 % (ref 11.5–15.5)
WBC: 13.5 10*3/uL — ABNORMAL HIGH (ref 4.0–10.5)
nRBC: 0 % (ref 0.0–0.2)

## 2018-08-19 LAB — I-STAT CG4 LACTIC ACID, ED
LACTIC ACID, VENOUS: 1.05 mmol/L (ref 0.5–1.9)
Lactic Acid, Venous: 1.41 mmol/L (ref 0.5–1.9)
Lactic Acid, Venous: 2.01 mmol/L (ref 0.5–1.9)

## 2018-08-19 LAB — LIPASE, BLOOD: Lipase: 36 U/L (ref 11–51)

## 2018-08-19 MED ORDER — LACTATED RINGERS IV BOLUS
500.0000 mL | Freq: Once | INTRAVENOUS | Status: AC
  Administered 2018-08-19: 500 mL via INTRAVENOUS

## 2018-08-19 MED ORDER — LACTATED RINGERS IV BOLUS
1000.0000 mL | Freq: Once | INTRAVENOUS | Status: AC
  Administered 2018-08-19: 1000 mL via INTRAVENOUS

## 2018-08-19 MED ORDER — SODIUM CHLORIDE 0.9 % IV SOLN
2.0000 g | INTRAVENOUS | Status: DC
  Administered 2018-08-19: 2 g via INTRAVENOUS
  Filled 2018-08-19: qty 20

## 2018-08-19 MED ORDER — IOHEXOL 300 MG/ML  SOLN
100.0000 mL | Freq: Once | INTRAMUSCULAR | Status: AC | PRN
  Administered 2018-08-19: 100 mL via INTRAVENOUS

## 2018-08-19 MED ORDER — SODIUM CHLORIDE 0.9 % IV SOLN
500.0000 mg | INTRAVENOUS | Status: DC
  Administered 2018-08-19: 500 mg via INTRAVENOUS
  Filled 2018-08-19: qty 500

## 2018-08-19 MED ORDER — SODIUM CHLORIDE 0.9 % IV BOLUS: 500.0000 mL | Freq: Once | INTRAVENOUS | Status: DC

## 2018-08-19 MED ORDER — LACTATED RINGERS IV SOLN
INTRAVENOUS | Status: DC
  Administered 2018-08-19 (×2): via INTRAVENOUS

## 2018-08-19 MED ORDER — SODIUM CHLORIDE 0.9 % IV SOLN
INTRAVENOUS | Status: AC
  Filled 2018-08-19: qty 500

## 2018-08-19 MED ORDER — FENTANYL CITRATE (PF) 100 MCG/2ML IJ SOLN
50.0000 ug | Freq: Once | INTRAMUSCULAR | Status: AC
  Administered 2018-08-19: 50 ug via INTRAVENOUS
  Filled 2018-08-19: qty 2

## 2018-08-19 MED ORDER — MORPHINE SULFATE (PF) 4 MG/ML IV SOLN
4.0000 mg | Freq: Once | INTRAVENOUS | Status: AC
  Administered 2018-08-19: 4 mg via INTRAVENOUS
  Filled 2018-08-19: qty 1

## 2018-08-19 NOTE — ED Triage Notes (Signed)
Pt has been having left lower abdominal pain for the last week. Has not eaten and has only drank very little Pedialyte. Was seen by PCP today and noted to have a BP of 82 systolic. BP here 88/68.

## 2018-08-19 NOTE — ED Notes (Signed)
Pt currently in hallway 2 due to room 8 being cleaned

## 2018-08-19 NOTE — ED Notes (Signed)
Pt to CT at this time.

## 2018-08-19 NOTE — ED Provider Notes (Signed)
Patient currently awaiting transfer, [facility] ambulance will be here shortly.  No change in clinical status.   Daleen Bo, MD 08/19/18 1827

## 2018-08-19 NOTE — ED Notes (Signed)
Pt unable to provide urine sample at this time 

## 2018-08-19 NOTE — ED Notes (Signed)
[facility] RN Judson Roch states no needed lactic acids, pt is up to date on fluids and antibx. She is closing this case.

## 2018-08-19 NOTE — ED Notes (Signed)
Report given to Almyra Free RN at [place] with [facility] at this time. Per transport team they are approx 90 minutes. Accepted [phone] by Cindie Laroche, MD.

## 2018-08-19 NOTE — ED Provider Notes (Signed)
[facility] EMERGENCY DEPARTMENT Provider Note   CSN: [id number] Arrival date & time: 08/19/18  5329     History   Chief Complaint Chief Complaint  Patient presents with  . Abdominal Pain    HPI Sierra Roberts is a 68 y.o. female.  HPI  68 year old female presents with abdominal pain.  She was sent over by her doctor's office for hypotension as well.  Has been having abdominal pain in her lower abdomen, starting in the left side and radiating across to the right.  Has been about a week.  Most prominent when she tries to eat or drink.  She has noticed a cough since yesterday.  No chest pain or shortness of breath.  No vomiting or diarrhea but she has been constipated up until yesterday when she had a large, loose bowel movement.  Pain is about an 8 out of 10.  Some back pain when this for started but that is gone.  No urinary symptoms.  Past Medical History:  Diagnosis Date  . C. difficile diarrhea   . High cholesterol   . Hypertension     Patient Active Problem List   Diagnosis Date Noted  . Encounter for screening colonoscopy 12/05/2012  . Rectal bleeding 10/16/2012    Past Surgical History:  Procedure Laterality Date  . CHOLECYSTECTOMY    . COLONOSCOPY N/A 12/30/2012   Procedure: COLONOSCOPY;  Surgeon: Daneil Dolin, MD;  Location: [facility] END[facility] SUITE;  Service: Endoscopy;  Laterality: N/A;  8:30-rescheduled to 9:30 Darius Bump notified pt  . DILATION AND CURETTAGE OF UTERUS     to remove IUD     OB History   No obstetric history on file.      Home Medications    Prior to Admission medications   Medication Sig Start Date End Date Taking? Authorizing Provider  acetaminophen (TYLENOL) 500 MG tablet Take 1,000 mg by mouth every 6 (six) hours as needed.   Yes [provider]  simvastatin (ZOCOR) 20 MG tablet Take 20 mg by mouth daily.   Yes [provider]  lisinopril-hydrochlorothiazide (PRINZIDE,ZESTORETIC) 20-12.5 MG per tablet Take 1 tablet by mouth  daily.    [provider]    Family History Family History  Problem Relation Age of Onset  . Colon cancer Neg Hx     Social History Social History   Tobacco Use  . Smoking status: Current Every Day Smoker    Packs/day: 0.50    Years: 44.00    Pack years: 22.00    Types: Cigarettes  . Smokeless tobacco: Never Used  . Tobacco comment: now down to less than 1/2 a pack  Substance Use Topics  . Alcohol use: Not Currently    Alcohol/week: 2.0 standard drinks    Types: 2 Glasses of wine per week  . Drug use: No     Allergies   Clams [shellfish allergy] and Flagyl [metronidazole]   Review of Systems Review of Systems  Constitutional: Negative for fever.  Respiratory: Positive for cough. Negative for shortness of breath.   Cardiovascular: Negative for chest pain.  Gastrointestinal: Positive for abdominal pain and constipation. Negative for diarrhea and vomiting.  Genitourinary: Negative for dysuria.  Musculoskeletal: Positive for back pain.  Neurological: Positive for light-headedness.  All other systems reviewed and are negative.    Physical Exam Updated Vital Signs BP 118/67   Pulse (!) 101   Temp 97.6 F (36.4 C) (Oral)   Resp (!) 25   Ht 5\' 6"  (  1.676 m)   Wt 91.6 kg   SpO2 94%   BMI 32.60 kg/m   Physical Exam Vitals signs and nursing note reviewed.  Constitutional:      Appearance: She is well-developed. She is obese.  HENT:     Head: Normocephalic and atraumatic.     Right Ear: External ear normal.     Left Ear: External ear normal.     Nose: Nose normal.  Eyes:     General:        Right eye: No discharge.        Left eye: No discharge.  Cardiovascular:     Rate and Rhythm: Regular rhythm. Tachycardia present.     Heart sounds: Normal heart sounds.  Pulmonary:     Effort: Pulmonary effort is normal. No tachypnea, accessory muscle usage or respiratory distress.     Breath sounds: Examination of the right-lower field reveals decreased  breath sounds and rales. Decreased breath sounds and rales present.  Abdominal:     Palpations: Abdomen is soft.     Tenderness: There is no abdominal tenderness.  Skin:    General: Skin is warm and dry.  Neurological:     Mental Status: She is alert.  Psychiatric:        Mood and Affect: Mood is not anxious.      ED Treatments / Results  Labs (all labs ordered are listed, but only abnormal results are displayed) Labs Reviewed  COMPREHENSIVE METABOLIC PANEL - Abnormal; Notable for the following components:      Result Value   Glucose, Bld 124 (*)    BUN 31 (*)    Creatinine, Ser 1.16 (*)    GFR calc non Af Amer 49 (*)    GFR calc Af Amer 56 (*)    All other components within normal limits  CBC WITH DIFFERENTIAL/PLATELET - Abnormal; Notable for the following components:   WBC 13.5 (*)    Platelets 472 (*)    Neutro Abs 10.4 (*)    Abs Immature Granulocytes 0.39 (*)    All other components within normal limits  I-STAT CG4 LACTIC ACID, ED - Abnormal; Notable for the following components:   Lactic Acid, Venous 2.01 (*)    All other components within normal limits  CULTURE, BLOOD (ROUTINE X 2)  CULTURE, BLOOD (ROUTINE X 2)  URINE CULTURE  LIPASE, BLOOD  URINALYSIS, ROUTINE W REFLEX MICROSCOPIC  CA 125  CEA  I-STAT CG4 LACTIC ACID, ED  I-STAT CG4 LACTIC ACID, ED  I-STAT CG4 LACTIC ACID, ED    EKG EKG Interpretation  Date/Time:  Tuesday August 19 2018 10:42:32 EST Ventricular Rate:  103 PR Interval:    QRS Duration: 92 QT Interval:  342 QTC Calculation: 448 R Axis:   46 Text Interpretation:  Sinus tachycardia Low voltage, precordial leads Abnormal R-wave progression, early transition Borderline T abnormalities, anterior leads No old tracing to compare Confirmed by Sherwood Gambler [phone]) on 08/19/2018 1:57:04 PM   Radiology Ct Abdomen Pelvis W Contrast  Result Date: 08/19/2018 CLINICAL DATA:  Left lower abdominal pain for 1 week. History of cholecystectomy.  EXAM: CT ABDOMEN AND PELVIS WITH CONTRAST TECHNIQUE: Multidetector CT imaging of the abdomen and pelvis was performed using the standard protocol following bolus administration of intravenous contrast. CONTRAST:  137mL OMNIPAQUE IOHEXOL 300 MG/ML  SOLN COMPARISON:  10/28/2007 CT abdomen/pelvis. FINDINGS: Motion degraded scan, limiting assessment. Lower chest: Large right pleural effusion. Hepatobiliary: Normal liver size. No liver mass. Cholecystectomy. Bile ducts  are within normal post cholecystectomy limits. Pancreas: Normal, with no mass or duct dilation. Spleen: Normal size. No mass. Adrenals/Urinary Tract: Normal adrenals. No hydronephrosis. Simple right renal cysts, largest 2.5 cm. Additional subcentimeter hypodense renal cortical lesions in the right kidney are too small to characterize and require no follow-up. Normal bladder. Stomach/Bowel: Normal non-distended stomach. Normal caliber small bowel with no small bowel wall thickening. Normal appendix. Moderate diffuse colonic diverticulosis. There is mild wall thickening throughout the right colon with haziness of the right pericolonic fat. Vascular/Lymphatic: Atherosclerotic nonaneurysmal abdominal aorta. Patent portal, splenic, hepatic and renal veins. No pathologically enlarged lymph nodes in the abdomen or pelvis. Reproductive: There is a complex cystic 5.4 x 4.5 cm right adnexal mass (series 2/image 66). There is a markedly enlarged uterus with dominant 14.6 cm exophytic anterior uterine mass with coarse posterior internal calcifications, which is essentially new since 2009. Other: Small volume ascites. No focal fluid collection. No pneumoperitoneum. Musculoskeletal: No aggressive appearing focal osseous lesions. Marked thoracolumbar spondylosis. IMPRESSION: 1. Markedly enlarged uterus with dominant 14.6 cm exophytic anterior uterine mass, which is essentially new since 2009 CT. Differential includes bulky uterine fibroid versus leiomyosarcoma. Gyn  consultation suggested. 2. Complex cystic 5.4 cm right adnexal mass, right ovarian neoplasm not excluded. 3. MRI pelvis without and with IV contrast may be considered for further characterization of these gynecologic masses. 4. Mild wall thickening throughout the right colon with haziness of the pericolonic fat, which could be due to noninflammatory edema versus nonspecific mild colitis. Although there is moderate diffuse colonic diverticulosis, these findings are not favored represent acute diverticulitis. 5. Small volume ascites. 6. Large right pleural effusion. 7.  Aortic Atherosclerosis (ICD10-I70.0). Electronically Signed   By: Ilona Sorrel M.D.   On: 08/19/2018 12:44   Dg Chest Port 1 View  Result Date: 08/19/2018 CLINICAL DATA:  Cough. EXAM: PORTABLE CHEST 1 VIEW COMPARISON:  None. FINDINGS: The heart size and mediastinal contours are within normal limits. Large right pleural effusion is noted with probable underlying atelectasis or infiltrate. Left lung is clear. No pneumothorax is noted. The visualized skeletal structures are unremarkable. IMPRESSION: Large right pleural effusion is noted with probable underlying atelectasis or infiltrate. Electronically Signed   By: Marijo Conception, M.D.   On: 08/19/2018 10:39    Procedures Procedures (including critical care time)  Medications Ordered in ED Medications  cefTRIAXone (ROCEPHIN) 2 g in sodium chloride 0.9 % 100 mL IVPB ( Intravenous Stopped 08/19/18 1147)  azithromycin (ZITHROMAX) 500 mg in sodium chloride 0.9 % 250 mL IVPB (0 mg Intravenous Stopped 08/19/18 1244)  sodium chloride 0.9 % with azithromycin (ZITHROMAX) ADS Med (has no administration in time range)  lactated ringers infusion ( Intravenous New Bag/Given 08/19/18 1433)  lactated ringers bolus 1,000 mL (0 mLs Intravenous Stopped 08/19/18 1211)  fentaNYL (SUBLIMAZE) injection 50 mcg (50 mcg Intravenous Given 08/19/18 1247)  iohexol (OMNIPAQUE) 300 MG/ML solution 100 mL (100 mLs  Intravenous Contrast Given 08/19/18 1215)  morphine 4 MG/ML injection 4 mg (4 mg Intravenous Given 08/19/18 1342)  lactated ringers bolus 500 mL (0 mLs Intravenous Stopped 08/19/18 1414)     Initial Impression / Assessment and Plan / ED Course  I have reviewed the triage vital signs and the nursing notes.  Pertinent labs & imaging results that were available during my care of the patient were reviewed by me and considered in my medical decision making (see chart for details).  Clinical Course as of Aug 19 1514  Tue Aug 19, 2018  1342 I discussed with Dr. Clarene Essex, asks for disc of CT and CEA in addition to CA 125. It will be at least hours prior to patient getting transferred, they have no beds. Thus will admit here until transfer.   [SG]    Clinical Course User Index [SG] Sherwood Gambler, MD    Patient's blood pressure has responded to fluids and she is no longer hypotensive.  Given the cough, chest x-ray findings, and hypotension, she was originally treated for pneumonia, especially given no abdominal tenderness.  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  I discussed with Dr. Elonda Husky who advises transfer to [facility] for gynecology/oncology.  She will need admission for hydration.  Mild bump in creatinine.  However I think this is likely more prerenal dehydration rather than sepsis.  I discussed with Dr. Clarene Essex at [facility] who accepts for admission and transfer.  [facility] notes that there is no current bed available and they cannot give a time for when 1 will open.  Thus I discussed with Dr. Wynetta Emery of the hospitalist service but he recommends that there would be no extra benefit from her coming into the hospital at this time.  We agreed to see if she will get a bed over the next couple hours but she may need reconsultation if not for further management and care.  Final Clinical Impressions(s) / ED Diagnoses   Final diagnoses:  Uterine mass    Acute kidney injury [facility])    ED Discharge Orders    None       Sherwood Gambler, MD 08/19/18 [phone]

## 2018-08-19 NOTE — ED Notes (Signed)
Pt informed a urine sample is needed at this time.  

## 2018-08-20 LAB — CA 125: CANCER ANTIGEN (CA) 125: 260 U/mL — AB (ref 0.0–38.1)

## 2018-08-20 LAB — CEA: CEA: 4.1 ng/mL (ref 0.0–4.7)

## 2018-08-21 LAB — URINE CULTURE: Culture: <10000 — AB

## 2018-08-22 DIAGNOSIS — J90 Pleural effusion, not elsewhere classified: Secondary | ICD-10-CM | POA: Insufficient documentation

## 2018-08-24 LAB — CULTURE, BLOOD (ROUTINE X 2)
Culture: NO GROWTH
Culture: NO GROWTH
Special Requests: ADEQUATE
Special Requests: ADEQUATE

## 2018-08-26 DIAGNOSIS — Z171 Estrogen receptor negative status [ER-]: Secondary | ICD-10-CM | POA: Insufficient documentation

## 2018-08-26 DIAGNOSIS — C50812 Malignant neoplasm of overlapping sites of left female breast: Secondary | ICD-10-CM | POA: Insufficient documentation

## 2018-08-27 DIAGNOSIS — C50912 Malignant neoplasm of unspecified site of left female breast: Secondary | ICD-10-CM | POA: Diagnosis not present

## 2018-08-27 DIAGNOSIS — Z171 Estrogen receptor negative status [ER-]: Secondary | ICD-10-CM | POA: Diagnosis not present

## 2018-08-27 DIAGNOSIS — Z5181 Encounter for therapeutic drug level monitoring: Secondary | ICD-10-CM | POA: Diagnosis not present

## 2018-08-27 DIAGNOSIS — Z79899 Other long term (current) drug therapy: Secondary | ICD-10-CM | POA: Diagnosis not present

## 2018-08-27 DIAGNOSIS — F17210 Nicotine dependence, cigarettes, uncomplicated: Secondary | ICD-10-CM | POA: Diagnosis not present

## 2018-08-27 DIAGNOSIS — N858 Other specified noninflammatory disorders of uterus: Secondary | ICD-10-CM | POA: Diagnosis not present

## 2018-08-27 DIAGNOSIS — J90 Pleural effusion, not elsewhere classified: Secondary | ICD-10-CM | POA: Diagnosis not present

## 2018-08-27 DIAGNOSIS — R0689 Other abnormalities of breathing: Secondary | ICD-10-CM | POA: Diagnosis not present

## 2018-08-27 DIAGNOSIS — N852 Hypertrophy of uterus: Secondary | ICD-10-CM | POA: Diagnosis not present

## 2018-08-27 DIAGNOSIS — C773 Secondary and unspecified malignant neoplasm of axilla and upper limb lymph nodes: Secondary | ICD-10-CM | POA: Diagnosis not present

## 2018-08-27 DIAGNOSIS — Z881 Allergy status to other antibiotic agents status: Secondary | ICD-10-CM | POA: Diagnosis not present

## 2018-08-29 DIAGNOSIS — J90 Pleural effusion, not elsewhere classified: Secondary | ICD-10-CM | POA: Diagnosis not present

## 2018-08-29 DIAGNOSIS — Z79899 Other long term (current) drug therapy: Secondary | ICD-10-CM | POA: Diagnosis not present

## 2018-08-29 DIAGNOSIS — N852 Hypertrophy of uterus: Secondary | ICD-10-CM | POA: Diagnosis not present

## 2018-08-29 DIAGNOSIS — F17210 Nicotine dependence, cigarettes, uncomplicated: Secondary | ICD-10-CM | POA: Diagnosis not present

## 2018-08-29 DIAGNOSIS — Z124 Encounter for screening for malignant neoplasm of cervix: Secondary | ICD-10-CM | POA: Diagnosis not present

## 2018-08-29 DIAGNOSIS — C773 Secondary and unspecified malignant neoplasm of axilla and upper limb lymph nodes: Secondary | ICD-10-CM | POA: Diagnosis not present

## 2018-08-29 DIAGNOSIS — Z171 Estrogen receptor negative status [ER-]: Secondary | ICD-10-CM | POA: Diagnosis not present

## 2018-08-29 DIAGNOSIS — I10 Essential (primary) hypertension: Secondary | ICD-10-CM | POA: Diagnosis not present

## 2018-08-29 DIAGNOSIS — C50812 Malignant neoplasm of overlapping sites of left female breast: Secondary | ICD-10-CM | POA: Diagnosis not present

## 2018-08-29 DIAGNOSIS — R799 Abnormal finding of blood chemistry, unspecified: Secondary | ICD-10-CM | POA: Diagnosis not present

## 2018-08-29 DIAGNOSIS — Z01818 Encounter for other preprocedural examination: Secondary | ICD-10-CM | POA: Diagnosis not present

## 2018-08-29 DIAGNOSIS — K5790 Diverticulosis of intestine, part unspecified, without perforation or abscess without bleeding: Secondary | ICD-10-CM | POA: Diagnosis not present

## 2018-08-29 DIAGNOSIS — R1900 Intra-abdominal and pelvic swelling, mass and lump, unspecified site: Secondary | ICD-10-CM | POA: Diagnosis not present

## 2018-08-29 DIAGNOSIS — E785 Hyperlipidemia, unspecified: Secondary | ICD-10-CM | POA: Diagnosis not present

## 2018-09-01 ENCOUNTER — Other Ambulatory Visit: Payer: Self-pay

## 2018-09-01 ENCOUNTER — Emergency Department (HOSPITAL_COMMUNITY): Payer: Medicare Other

## 2018-09-01 ENCOUNTER — Encounter (HOSPITAL_COMMUNITY): Payer: Self-pay | Admitting: Emergency Medicine

## 2018-09-01 ENCOUNTER — Inpatient Hospital Stay (HOSPITAL_COMMUNITY)
Admission: EM | Admit: 2018-09-01 | Discharge: 2018-09-04 | DRG: 696 | Disposition: A | Discharge status: Other Acute Inpatient Hospital | Payer: Medicare Other | Attending: Internal Medicine | Admitting: Internal Medicine

## 2018-09-01 DIAGNOSIS — Z9049 Acquired absence of other specified parts of digestive tract: Secondary | ICD-10-CM | POA: Diagnosis not present

## 2018-09-01 DIAGNOSIS — D72829 Elevated white blood cell count, unspecified: Secondary | ICD-10-CM | POA: Diagnosis not present

## 2018-09-01 DIAGNOSIS — E7800 Pure hypercholesterolemia, unspecified: Secondary | ICD-10-CM | POA: Diagnosis present

## 2018-09-01 DIAGNOSIS — R339 Retention of urine, unspecified: Principal | ICD-10-CM

## 2018-09-01 DIAGNOSIS — F17210 Nicotine dependence, cigarettes, uncomplicated: Secondary | ICD-10-CM | POA: Diagnosis not present

## 2018-09-01 DIAGNOSIS — J90 Pleural effusion, not elsewhere classified: Secondary | ICD-10-CM | POA: Diagnosis present

## 2018-09-01 DIAGNOSIS — Z881 Allergy status to other antibiotic agents status: Secondary | ICD-10-CM

## 2018-09-01 DIAGNOSIS — K5790 Diverticulosis of intestine, part unspecified, without perforation or abscess without bleeding: Secondary | ICD-10-CM | POA: Diagnosis not present

## 2018-09-01 DIAGNOSIS — R000 Tachycardia, unspecified: Secondary | ICD-10-CM | POA: Diagnosis not present

## 2018-09-01 DIAGNOSIS — Z91013 Allergy to seafood: Secondary | ICD-10-CM

## 2018-09-01 DIAGNOSIS — N852 Hypertrophy of uterus: Secondary | ICD-10-CM | POA: Diagnosis present

## 2018-09-01 DIAGNOSIS — C50912 Malignant neoplasm of unspecified site of left female breast: Secondary | ICD-10-CM | POA: Diagnosis present

## 2018-09-01 DIAGNOSIS — I10 Essential (primary) hypertension: Secondary | ICD-10-CM | POA: Diagnosis present

## 2018-09-01 DIAGNOSIS — R0602 Shortness of breath: Secondary | ICD-10-CM

## 2018-09-01 DIAGNOSIS — Z720 Tobacco use: Secondary | ICD-10-CM | POA: Diagnosis present

## 2018-09-01 DIAGNOSIS — D630 Anemia in neoplastic disease: Secondary | ICD-10-CM | POA: Diagnosis present

## 2018-09-01 DIAGNOSIS — R1030 Lower abdominal pain, unspecified: Secondary | ICD-10-CM | POA: Diagnosis not present

## 2018-09-01 DIAGNOSIS — R109 Unspecified abdominal pain: Secondary | ICD-10-CM | POA: Diagnosis not present

## 2018-09-01 DIAGNOSIS — D390 Neoplasm of uncertain behavior of uterus: Secondary | ICD-10-CM | POA: Diagnosis not present

## 2018-09-01 DIAGNOSIS — R1084 Generalized abdominal pain: Secondary | ICD-10-CM | POA: Diagnosis not present

## 2018-09-01 DIAGNOSIS — R1900 Intra-abdominal and pelvic swelling, mass and lump, unspecified site: Secondary | ICD-10-CM | POA: Diagnosis present

## 2018-09-01 HISTORY — DX: Malignant (primary) neoplasm, unspecified: C80.1

## 2018-09-01 LAB — COMPREHENSIVE METABOLIC PANEL
ALT: 13 U/L (ref 0–44)
AST: 22 U/L (ref 15–41)
Albumin: 2.8 g/dL — ABNORMAL LOW (ref 3.5–5.0)
Alkaline Phosphatase: 98 U/L (ref 38–126)
Anion gap: 10 (ref 5–15)
BUN: 16 mg/dL (ref 8–23)
CO2: 21 mmol/L — ABNORMAL LOW (ref 22–32)
CREATININE: 0.85 mg/dL (ref 0.44–1.00)
Calcium: 8.7 mg/dL — ABNORMAL LOW (ref 8.9–10.3)
Chloride: 105 mmol/L (ref 98–111)
GFR calc Af Amer: >60 mL/min (ref >60)
GFR calc non Af Amer: >60 mL/min (ref >60)
Glucose, Bld: 115 mg/dL — ABNORMAL HIGH (ref 70–99)
Potassium: 3.7 mmol/L (ref 3.5–5.1)
Sodium: 136 mmol/L (ref 135–145)
Total Bilirubin: 1 mg/dL (ref 0.3–1.2)
Total Protein: 7.1 g/dL (ref 6.5–8.1)

## 2018-09-01 LAB — URINALYSIS, ROUTINE W REFLEX MICROSCOPIC
BILIRUBIN URINE: NEGATIVE
Glucose, UA: NEGATIVE mg/dL
Hgb urine dipstick: NEGATIVE
Ketones, ur: NEGATIVE mg/dL
Leukocytes, UA: NEGATIVE
NITRITE: NEGATIVE
Protein, ur: NEGATIVE mg/dL
Specific Gravity, Urine: 1.025 (ref 1.005–1.030)
pH: 7 (ref 5.0–8.0)

## 2018-09-01 LAB — CBC WITH DIFFERENTIAL/PLATELET
ABS IMMATURE GRANULOCYTES: 0.8 10*3/uL — AB (ref 0.00–0.07)
BASOS PCT: 1 %
Basophils Absolute: 0.1 10*3/uL (ref 0.0–0.1)
Eosinophils Absolute: 0.2 10*3/uL (ref 0.0–0.5)
Eosinophils Relative: 1 %
HCT: 34 % — ABNORMAL LOW (ref 36.0–46.0)
Hemoglobin: 10.6 g/dL — ABNORMAL LOW (ref 12.0–15.0)
Immature Granulocytes: 4 %
Lymphocytes Relative: 8 %
Lymphs Abs: 1.6 10*3/uL (ref 0.7–4.0)
MCH: 29.8 pg (ref 26.0–34.0)
MCHC: 31.2 g/dL (ref 30.0–36.0)
MCV: 95.5 fL (ref 80.0–100.0)
Monocytes Absolute: 1.2 10*3/uL — ABNORMAL HIGH (ref 0.1–1.0)
Monocytes Relative: 6 %
Neutro Abs: 16.4 10*3/uL — ABNORMAL HIGH (ref 1.7–7.7)
Neutrophils Relative %: 80 %
Platelets: 492 10*3/uL — ABNORMAL HIGH (ref 150–400)
RBC: 3.56 MIL/uL — ABNORMAL LOW (ref 3.87–5.11)
RDW: 13.6 % (ref 11.5–15.5)
WBC: 20.3 10*3/uL — ABNORMAL HIGH (ref 4.0–10.5)
nRBC: 0 % (ref 0.0–0.2)

## 2018-09-01 MED ORDER — SODIUM CHLORIDE 0.9 % IV BOLUS
1000.0000 mL | Freq: Once | INTRAVENOUS | Status: AC
  Administered 2018-09-01: 1000 mL via INTRAVENOUS

## 2018-09-01 MED ORDER — IOPAMIDOL (ISOVUE-300) INJECTION 61%
100.0000 mL | Freq: Once | INTRAVENOUS | Status: AC | PRN
  Administered 2018-09-01: 100 mL via INTRAVENOUS

## 2018-09-01 MED ORDER — ONDANSETRON HCL 4 MG/2ML IJ SOLN
4.0000 mg | Freq: Once | INTRAMUSCULAR | Status: AC
  Administered 2018-09-01: 4 mg via INTRAVENOUS
  Filled 2018-09-01: qty 2

## 2018-09-01 NOTE — H&P (Addendum)
History and Physical    PLEASE NOTE THAT DRAGON DICTATION SOFTWARE WAS USED IN THE CONSTRUCTION OF THIS NOTE.   [name]:[id number] DOB: Oct 05, 1950 DOA: 09/01/2018  PCP: Cory Munch, PA-C Patient coming from: home  I have personally briefly reviewed patient's old medical records in [place]  Chief Complaint: abdominal pain  HPI: Sierra Roberts is a 68 y.o. female with medical history significant for breast cancer, uterine mass, who is admitted for observation to [facility] on 09/01/2018 with urinary retention after presenting from home to the [facility] emergency department complaining of abdominal discomfort.  The patient presents with 3 to 4 days of progressive abdominal discomfort, that is somewhat multifactorial in its description.  She reports that she has been experiencing diffuse abdominal pressure as well as more focal crampy abdominal pain in the lower abdomen.  She also notes inability to pass urine over the last 1 to 2 days, which she states is new for her.  She conveys a recent finding of pelvic mass, believed to be uterine in nature, and states that she is scheduled for a biopsy of such at [facility] this Thursday, January 30.  She denies any subjective fever, chills, rigors, or generalized myalgias.  She denies any dysuria or hematuria.  Denies chest pain, shortness of breath, cough.  She also denies neck stiffness, nausea, vomiting, or diarrhea.  For her abdominal discomfort, the patient has been taking as needed short acting oxycodone at home, but states suboptimal pain control with this measure.   ED Course: Vital signs in the emergency department notable for the following: T-max 98.4, heart rate 10 9-1 16; blood pressure 116/75, which increased to 134/76 following interval IV fluids, as further described below, respiratory rate 22-26, and oxygen saturation 90 denies 3% on room air.  Labs from in the ED were notable for the following: CMP notable for  creatinine 0.85, relative to 1.16 on 08/19/2018; alkaline phosphatase 98, AST 22, ALT 13, and total bilirubin 1.0.  CBC notable for white cell count of 20,000 with 80% neutrophils, this is compared to 13,500 when checked on 08/19/2018.  Urinalysis showed no evidence of white blood cells, and was found to be nitrite as well as leukocyte esterase negative.  1 view chest x-ray, per final radiology report, compared to the chest x-ray on 08/19/2018, showed evidence of unchanged right-sided pleural effusion without evidence of overt infiltrate.  CT abdomen/pelvis, compared to CT abdomen/pelvis on 08/19/2018, showed evidence of distended bladder with suggestion of almost 200 cc volume, as well as unchanged uterine enlargement with associated uterine mass and right adnexal mass, diverticulosis in the absence of diverticulitis, as well as diminishing her right pleural effusion.  While in the ED, the following [place]: IV normal saline x2 L bolus.  A Foley catheter was placed, with ensuing passage of nearly 2 L urine.    Review of Systems: As per HPI otherwise 10 point review of systems negative.   Past Medical History:  Diagnosis Date  . C. difficile diarrhea   . Cancer ([place])   . High cholesterol   . Hypertension     Past Surgical History:  Procedure Laterality Date  . CHOLECYSTECTOMY    . COLONOSCOPY N/A 12/30/2012   Procedure: COLONOSCOPY;  Surgeon: Daneil Dolin, MD;  Location: [facility];  Service: Endoscopy;  Laterality: N/A;  8:30-rescheduled to 9:30 Darius Bump notified pt  . DILATION AND CURETTAGE OF UTERUS     to remove IUD  Social History:  reports that she has been smoking cigarettes. She has a 22.00 pack-year smoking history. She has never used smokeless tobacco. She reports previous alcohol use of about 2.0 standard drinks of alcohol per week. She reports that she does not use drugs.   Allergies  Allergen Reactions  . Clams [Shellfish Allergy] Diarrhea and Nausea And Vomiting  .  Flagyl [Metronidazole] Other (See Comments)    Numbness     Family History  Problem Relation Age of Onset  . Colon cancer Neg Hx      Prior to Admission medications   Medication Sig Start Date End Date Taking? Authorizing Provider  acetaminophen (TYLENOL) 500 MG tablet Take 1,000 mg by mouth every 6 (six) hours as needed for mild pain or moderate pain.    Yes [provider]  metoCLOPramide (REGLAN) 10 MG tablet Take 10 mg by mouth every 6 (six) hours.   Yes [provider]  Nicotine Polacrilex (COMMIT MT) Use as directed 1 lozenge in the mouth or throat daily as needed (for cessation).   Yes [provider]  oxycodone (OXY-IR) 5 MG capsule Take 5 mg by mouth every 6 (six) hours as needed for pain.   Yes [provider]  lisinopril-hydrochlorothiazide (PRINZIDE,ZESTORETIC) 20-12.5 MG per tablet Take 1 tablet by mouth daily.    [provider]  simvastatin (ZOCOR) 20 MG tablet Take 20 mg by mouth daily.    [provider]      Objective     Physical Exam: Vitals:   09/01/18 2100 09/01/18 2130 09/01/18 2200 09/01/18 2230  BP: 122/63 (!) 142/85 (!) 96/54 137/77  Pulse: (!) 109 (!) 115 (!) 116 (!) 113  Resp: (!) 41 (!) 42 (!) 22 (!) 37  Temp:      TempSrc:      SpO2: (!) 88% 91% 92% 90%  Weight:      Height:        General: appears to be stated age; alert, oriented Skin: warm, dry; mild erythema over the distal bilateral lower extremities in the absence of any tenderness or increased warmth. Head:  AT/[place] Mouth:  Oral mucosa membranes appear moist, normal dentition Neck: supple; trachea midline Heart: Mildly tachycardic, but regular; did not appreciate any M/R/G Lungs: Diminished right basilar breath sounds, but otherwise CTAB, did not appreciate any wheezes, rales, or rhonchi Abdomen: + BS; soft, ND; mild tenderness in the bilateral lower abdominal quadrants in the absence of any associated guarding, rigidity, or rebound  tenderness. Extremities: no peripheral edema, no muscle wasting   Labs on Admission: I have personally reviewed following labs and imaging studies  CBC: Recent Labs  Lab 09/01/18 1742  WBC 20.3*  NEUTROABS 16.4*  HGB 10.6*  HCT 34.0*  MCV 95.5  PLT 151*   Basic Metabolic Panel: Recent Labs  Lab 09/01/18 1742  NA 136  K 3.7  CL 105  CO2 21*  GLUCOSE 115*  BUN 16  CREATININE 0.85  CALCIUM 8.7*   GFR: Estimated Creatinine Clearance: 72.7 mL/min (by C-G formula based on SCr of 0.85 mg/dL). Liver Function Tests: Recent Labs  Lab 09/01/18 1742  AST 22  ALT 13  ALKPHOS 98  BILITOT 1.0  PROT 7.1  ALBUMIN 2.8*   No results for input(s): LIPASE, AMYLASE in the last 168 hours. No results for input(s): AMMONIA in the last 168 hours. Coagulation Profile: No results for input(s): INR, PROTIME in the last 168 hours. Cardiac Enzymes: No results for input(s): CKTOTAL,  CKMB, CKMBINDEX, TROPONINI in the last 168 hours. BNP (last 3 results) No results for input(s): PROBNP in the last 8760 hours. HbA1C: No results for input(s): HGBA1C in the last 72 hours. CBG: No results for input(s): GLUCAP in the last 168 hours. Lipid Profile: No results for input(s): CHOL, HDL, LDLCALC, TRIG, CHOLHDL, LDLDIRECT in the last 72 hours. Thyroid Function Tests: No results for input(s): TSH, T4TOTAL, FREET4, T3FREE, THYROIDAB in the last 72 hours. Anemia Panel: No results for input(s): VITAMINB12, FOLATE, FERRITIN, TIBC, IRON, RETICCTPCT in the last 72 hours. Urine analysis:    Component Value Date/Time   COLORURINE YELLOW 09/01/2018 2136   APPEARANCEUR CLEAR 09/01/2018 2136   LABSPEC 1.025 09/01/2018 2136   PHURINE 7.0 09/01/2018 2136   GLUCOSEU NEGATIVE 09/01/2018 2136   HGBUR NEGATIVE 09/01/2018 2136   BILIRUBINUR NEGATIVE 09/01/2018 2136   KETONESUR NEGATIVE 09/01/2018 2136   PROTEINUR NEGATIVE 09/01/2018 2136   UROBILINOGEN 0.2 10/28/2007 1919   NITRITE NEGATIVE 09/01/2018 2136    LEUKOCYTESUR NEGATIVE 09/01/2018 2136    Radiological Exams on Admission: Dg Chest 1 View  Result Date: 09/01/2018 CLINICAL DATA:  68 year old female with a history of abdominal pain EXAM: CHEST  1 VIEW COMPARISON:  08/19/2018 FINDINGS: Cardiomediastinal silhouette unchanged in size and contour. Persistent obscuration the right hemidiaphragm and the right heart border by dense opacity at the right lung base, layer to the apex. Pleuroparenchymal thickening at the right apex. Left lung relatively well aerated with coarsened interstitial markings. IMPRESSION: Right-sided pleural effusion, similar to the comparison with obscuration the right heart border and the right hemidiaphragm. Associated atelectasis/consolidation. Left lung relatively well aerated. Electronically Signed   By: Corrie Mckusick D.O.   On: 09/01/2018 21:28   Ct Abdomen Pelvis W Contrast  Result Date: 09/01/2018 CLINICAL DATA:  Abdominal distension. Ongoing abdominal pain. Patient reports she has not voided since yesterday. No bowel movement in 1 week. EXAM: CT ABDOMEN AND PELVIS WITH CONTRAST TECHNIQUE: Multidetector CT imaging of the abdomen and pelvis was performed using the standard protocol following bolus administration of intravenous contrast. CONTRAST:  153mL ISOVUE-300 IOPAMIDOL (ISOVUE-300) INJECTION 61% COMPARISON:  Abdominal CT 08/19/2018 FINDINGS: Lower chest: Right pleural effusion is likely decreased in size, persistent moderate volume of pleural fluid. Associated atelectasis of the right lower and right middle lobes. Lobulated 5.3 cm left breast mass. Adjacent 2.6 cm rounded density in the lower left chest wall soft tissues. Hepatobiliary: No focal hepatic lesion. Postcholecystectomy with unchanged biliary prominence, common bile duct measures 12 mm at the porta hepatis. No visualized choledocholithiasis. Pancreas: No ductal dilatation or inflammation. Spleen: Normal in size without focal abnormality. Adrenals/Urinary Tract:  No adrenal nodule. No hydronephrosis. Mild perinephric edema, slightly progressed from prior exam. Simple cysts in the right kidney. Homogeneous renal enhancement with symmetric excretion on delayed phase imaging. Bladder distention to the level of the umbilicus, bladder volume 19.3 x 14.7 x 13.4 cm (volume = 1990 cm^3). No bladder wall thickening. Stomach/Bowel: Lack of enteric contrast limits bowel assessment. The stomach is nondistended. No small bowel obstruction. Wall thickening of the ascending colon has slightly improved from prior exam. Pericolonic/right lower quadrant haziness persists. There is sigmoid colonic redundancy. Distal colonic diverticulosis without diverticulitis. Small to moderate colonic stool burden. No free air. Vascular/Lymphatic: Aortic atherosclerosis. No aneurysm. Multiple small retroperitoneal nodes largest 7 mm. No definite pelvic adenopathy. Reproductive: Enlarged uterus with uterine calcifications. Large soft tissue mass in the right pelvis appears contiguous with enlarged uterus, unchanged in size from prior exam but  no deviated more to the right. Background enlarged uterus with multiple calcifications. Complex right adnexal mass measuring 4.8 x 3.6 cm is again seen. Other: Stranding/edema in the right pelvis small amount of free fluid in the right pericolic gutter, similar to prior exam. Musculoskeletal: Degenerative change in the spine. No suspicious bone lesion. IMPRESSION: 1. Distended urinary bladder with bladder volume of nearly 2000 cc. 2. Enlarged uterus with large exophytic uterine mass as well as right adnexal mass, unchanged from CT 2 weeks ago. Recommend correlation with prior workup. 3. Improved wall thickening of the ascending colon. Mild adjacent pericolonic and right lower quadrant edema persists. Colonic diverticulosis without diverticulitis. 4. Right pleural effusion has likely decreased in size, with moderate persisting effusion. 5. Left breast mass. 6.  Aortic  Atherosclerosis (ICD10-I70.0). Electronically Signed   By: Keith Rake M.D.   On: 09/01/2018 21:17     Assessment/Plan   Sierra Roberts is a 68 y.o. female with medical history significant for breast cancer, uterine mass, who is admitted for observation to [facility] on 09/01/2018 with urinary retention after presenting from home to the [facility] emergency department complaining of abdominal discomfort.   Principal Problem:   Urinary retention Active Problems:   Pelvic mass   Leukocytosis   Abdominal pain   Tobacco abuse   #) Urinary retention: The patient presents with complaint of 1 to 2 days of inability to pass urine, with CT abdomen/pelvis performed today suggestive of up to 2 L volume within the bladder.  Believed to represent urinary retention on the basis of compressive uropathy stemming from uterine mass, with biopsy of such scheduled to occur at [facility] this Thursday, 09/04/2018.  Obstruction of the urinary outflow tract is further substantiated by finding of nearly 2 L of urine out upon placement of Foley catheter in the ED this evening, which was associated with resolution of the pressure component to patient's presenting abdominal discomfort.  The patient is amenable to ultimately being discharged to home with Foley catheter in place, and plan to follow-up with urology, however, she is concerned about interval pain control relating to residual abdominal discomfort leading up to Thursday's scheduled biopsy.   Plan: Foley catheter placed in the ED this evening to remain in place, with plan to ultimately discharge with Foley in place.  Will need outpatient urology follow-up.  Monitor strict I's and O's.  Repeat BMP in the morning.  Patient to follow-up with [facility] surgery on Thursday of this week for biopsy of pelvic mass, which appears to be playing a compressive role leading to presenting urinary retention.   #) Abdominal discomfort: Patient presents complaining of  progressive crampy abdominal discomfort as well as abdominal pressure, with the latter of which resolved following placement of Foley catheter and ensuing drainage of almost 2 L of urine, is further described above.  However, in spite of resolution of abdominal pressure, the patient continues to report crampy abdominal pain, for which she reports pain control is not yet adequate on her outpatient regimen of PRN oxycodone IR.  Therefore, component of the patient's overnight observation is geared towards improved pain control.  Suspect residual abdominal discomfort with contribution from after mentioned pelvic mass.  Of note, this evening CT abdomen/pelvis demonstrates no evidence of bowel perforation, abscess, or obstruction.  Plan: Fentanyl 25 to 50 mcg IV every hour as needed.  We will also continue as needed oxycodone IR, with goal of finding an oral analgesic regimen provides adequate pain control upon  which the patient can be d\c'ed to home, with plan for biopsy of abdominal mass on Thursday.     #) Leukocytosis: This evening's labs reflect white blood cell count of 20,000 with 80% neutrophils, up slightly from 13,500 on 08/19/2018.  In the absence of any evidence of underlying infectious process, suspect this is inflammatory in nature, with contributions from after mentioned pelvic mass.  Of note, urinalysis is not suggestive of underlying urinary tract infection, chest x-ray demonstrates no evidence of infiltrate.  Furthermore, the patient is not complaining of any neck stiffness, rash, or diarrhea.  Plan: Repeat CBC with differential in the morning.  Further outpatient diagnostic work-up of underlying pelvic mass, is further described above.    #) Essential hypertension: The patient reports that she is not currently on any antihypertensive agents at home.  Previously she notes that she was on lisinopril as well as HCTZ, however these medications were discontinued several weeks ago in the context of  "soft" blood pressures while on these medications.  Presenting blood pressure this evening noted to be normotensive at 116/75.  Plan: We will continue to monitor ensuing blood pressure via routine vital signs.    #) Tobacco abuse: Patient knowledges that she is a current smoker, having smoked approximately half pack per day over the preceding greater than 40 years.  She has been able to slightly reduce her smoking recently to slightly under 1/2 pack/day via interval use of PRN nicotine lozenges.  Plan: Counseled patient on the importance of complete smoking discontinuation.  I reordered her outpatient PRN nicotine lozenges.   DVT prophylaxis: scd's Code Status: full Family Communication: Patient's case was discussed with her husband, who is present at bedside. Disposition Plan:  Per Rounding Team Consults called: (none)  Admission status:  Observation ; med-surg.    PLEASE NOTE THAT DRAGON DICTATION SOFTWARE WAS USED IN THE CONSTRUCTION OF THIS NOTE.   Rhetta Mura DO [facility] Pager [phone] From 7PM- 7 AM.    09/01/2018, 11:02 PM

## 2018-09-01 NOTE — ED Provider Notes (Signed)
[facility] EMERGENCY DEPARTMENT Provider Note   CSN: [id number] Arrival date & time: 09/01/18  1655     History   Chief Complaint Chief Complaint  Patient presents with  . Abdominal Pain    HPI Sierra Roberts is a 68 y.o. female.  Patient complains of abdominal pain.  But she also complains of inability to urinate for last 2 days.  The history is provided by the patient.  Abdominal Pain  Pain location:  Generalized Pain quality: aching   Pain radiates to:  Does not radiate Pain severity:  Moderate Onset quality:  Gradual Timing:  Constant Progression:  Waxing and waning Chronicity:  Recurrent Context: not alcohol use   Associated symptoms: no chest pain, no cough, no diarrhea, no fatigue and no hematuria     Past Medical History:  Diagnosis Date  . C. difficile diarrhea   . Cancer ([place])   . High cholesterol   . Hypertension     Patient Active Problem List   Diagnosis Date Noted  . Urinary retention 09/01/2018  . Encounter for screening colonoscopy 12/05/2012  . Rectal bleeding 10/16/2012    Past Surgical History:  Procedure Laterality Date  . CHOLECYSTECTOMY    . COLONOSCOPY N/A 12/30/2012   Procedure: COLONOSCOPY;  Surgeon: Daneil Dolin, MD;  Location: [facility];  Service: Endoscopy;  Laterality: N/A;  8:30-rescheduled to 9:30 Darius Bump notified pt  . DILATION AND CURETTAGE OF UTERUS     to remove IUD     OB History    Gravida  1   Para  1   Term  1   Preterm      AB      Living        SAB      TAB      Ectopic      Multiple      Live Births               Home Medications    Prior to Admission medications   Medication Sig Start Date End Date Taking? Authorizing Provider  acetaminophen (TYLENOL) 500 MG tablet Take 1,000 mg by mouth every 6 (six) hours as needed for mild pain or moderate pain.    Yes [provider]  metoCLOPramide (REGLAN) 10 MG tablet Take 10 mg by mouth every 6 (six) hours.   Yes [provider]  Nicotine Polacrilex (COMMIT MT) Use as directed 1 lozenge in the mouth or throat daily as needed (for cessation).   Yes [provider]  oxycodone (OXY-IR) 5 MG capsule Take 5 mg by mouth every 6 (six) hours as needed for pain.   Yes [provider]  lisinopril-hydrochlorothiazide (PRINZIDE,ZESTORETIC) 20-12.5 MG per tablet Take 1 tablet by mouth daily.    [provider]  simvastatin (ZOCOR) 20 MG tablet Take 20 mg by mouth daily.    [provider]    Family History Family History  Problem Relation Age of Onset  . Colon cancer Neg Hx     Social History Social History   Tobacco Use  . Smoking status: Current Every Day Smoker    Packs/day: 0.50    Years: 44.00    Pack years: 22.00    Types: Cigarettes  . Smokeless tobacco: Never Used  . Tobacco comment: now down to less than 1/2 a pack  Substance Use Topics  . Alcohol use: Not Currently    Alcohol/week: 2.0 standard drinks    Types: 2  Glasses of wine per week  . Drug use: No     Allergies   Clams [shellfish allergy] and Flagyl [metronidazole]   Review of Systems Review of Systems  Constitutional: Negative for appetite change and fatigue.  HENT: Negative for congestion, ear discharge and sinus pressure.   Eyes: Negative for discharge.  Respiratory: Negative for cough.   Cardiovascular: Negative for chest pain.  Gastrointestinal: Positive for abdominal pain. Negative for diarrhea.  Genitourinary: Negative for frequency and hematuria.  Musculoskeletal: Negative for back pain.  Skin: Negative for rash.  Neurological: Negative for seizures and headaches.  Psychiatric/Behavioral: Negative for hallucinations.     Physical Exam Updated Vital Signs BP 137/77   Pulse (!) 113   Temp 98.4 F (36.9 C) (Oral)   Resp (!) 37   Ht 5\' 4"  (1.626 m)   Wt 97.1 kg   SpO2 90%   BMI 36.73 kg/m   Physical Exam Vitals signs and nursing note reviewed.  Constitutional:       Appearance: She is well-developed.  HENT:     Head: Normocephalic.  Eyes:     General: No scleral icterus.    Conjunctiva/sclera: Conjunctivae normal.  Neck:     Musculoskeletal: Neck supple.     Thyroid: No thyromegaly.  Cardiovascular:     Rate and Rhythm: Normal rate and regular rhythm.     Heart sounds: No murmur. No friction rub. No gallop.   Pulmonary:     Breath sounds: No stridor. No wheezing or rales.  Chest:     Chest wall: No tenderness.  Abdominal:     General: There is no distension.     Tenderness: There is abdominal tenderness. There is no rebound.  Musculoskeletal: Normal range of motion.  Lymphadenopathy:     Cervical: No cervical adenopathy.  Skin:    Findings: No erythema or rash.  Neurological:     Mental Status: She is oriented to person, place, and time.     Motor: No abnormal muscle tone.     Coordination: Coordination normal.  Psychiatric:        Behavior: Behavior normal.      ED Treatments / Results  Labs (all labs ordered are listed, but only abnormal results are displayed) Labs Reviewed  CBC WITH DIFFERENTIAL/PLATELET - Abnormal; Notable for the following components:      Result Value   WBC 20.3 (*)    RBC 3.56 (*)    Hemoglobin 10.6 (*)    HCT 34.0 (*)    Platelets 492 (*)    Neutro Abs 16.4 (*)    Monocytes Absolute 1.2 (*)    Abs Immature Granulocytes 0.80 (*)    All other components within normal limits  COMPREHENSIVE METABOLIC PANEL - Abnormal; Notable for the following components:   CO2 21 (*)    Glucose, Bld 115 (*)    Calcium 8.7 (*)    Albumin 2.8 (*)    All other components within normal limits  URINALYSIS, ROUTINE W REFLEX MICROSCOPIC    EKG EKG Interpretation  Date/Time:  Monday September 01 2018 17:35:31 EST Ventricular Rate:  108 PR Interval:    QRS Duration: 81 QT Interval:  315 QTC Calculation: 423 R Axis:   56 Text Interpretation:  Sinus tachycardia Probable left atrial enlargement Low voltage, precordial  leads Confirmed by Milton Ferguson [phone]) on 09/01/2018 10:04:06 PM   Radiology Dg Chest 1 View  Result Date: 09/01/2018 CLINICAL DATA:  68 year old female with a history of abdominal pain  EXAM: CHEST  1 VIEW COMPARISON:  08/19/2018 FINDINGS: Cardiomediastinal silhouette unchanged in size and contour. Persistent obscuration the right hemidiaphragm and the right heart border by dense opacity at the right lung base, layer to the apex. Pleuroparenchymal thickening at the right apex. Left lung relatively well aerated with coarsened interstitial markings. IMPRESSION: Right-sided pleural effusion, similar to the comparison with obscuration the right heart border and the right hemidiaphragm. Associated atelectasis/consolidation. Left lung relatively well aerated. Electronically Signed   By: Corrie Mckusick D.O.   On: 09/01/2018 21:28   Ct Abdomen Pelvis W Contrast  Result Date: 09/01/2018 CLINICAL DATA:  Abdominal distension. Ongoing abdominal pain. Patient reports she has not voided since yesterday. No bowel movement in 1 week. EXAM: CT ABDOMEN AND PELVIS WITH CONTRAST TECHNIQUE: Multidetector CT imaging of the abdomen and pelvis was performed using the standard protocol following bolus administration of intravenous contrast. CONTRAST:  174mL ISOVUE-300 IOPAMIDOL (ISOVUE-300) INJECTION 61% COMPARISON:  Abdominal CT 08/19/2018 FINDINGS: Lower chest: Right pleural effusion is likely decreased in size, persistent moderate volume of pleural fluid. Associated atelectasis of the right lower and right middle lobes. Lobulated 5.3 cm left breast mass. Adjacent 2.6 cm rounded density in the lower left chest wall soft tissues. Hepatobiliary: No focal hepatic lesion. Postcholecystectomy with unchanged biliary prominence, common bile duct measures 12 mm at the porta hepatis. No visualized choledocholithiasis. Pancreas: No ductal dilatation or inflammation. Spleen: Normal in size without focal abnormality. Adrenals/Urinary  Tract: No adrenal nodule. No hydronephrosis. Mild perinephric edema, slightly progressed from prior exam. Simple cysts in the right kidney. Homogeneous renal enhancement with symmetric excretion on delayed phase imaging. Bladder distention to the level of the umbilicus, bladder volume 19.3 x 14.7 x 13.4 cm (volume = 1990 cm^3). No bladder wall thickening. Stomach/Bowel: Lack of enteric contrast limits bowel assessment. The stomach is nondistended. No small bowel obstruction. Wall thickening of the ascending colon has slightly improved from prior exam. Pericolonic/right lower quadrant haziness persists. There is sigmoid colonic redundancy. Distal colonic diverticulosis without diverticulitis. Small to moderate colonic stool burden. No free air. Vascular/Lymphatic: Aortic atherosclerosis. No aneurysm. Multiple small retroperitoneal nodes largest 7 mm. No definite pelvic adenopathy. Reproductive: Enlarged uterus with uterine calcifications. Large soft tissue mass in the right pelvis appears contiguous with enlarged uterus, unchanged in size from prior exam but no deviated more to the right. Background enlarged uterus with multiple calcifications. Complex right adnexal mass measuring 4.8 x 3.6 cm is again seen. Other: Stranding/edema in the right pelvis small amount of free fluid in the right pericolic gutter, similar to prior exam. Musculoskeletal: Degenerative change in the spine. No suspicious bone lesion. IMPRESSION: 1. Distended urinary bladder with bladder volume of nearly 2000 cc. 2. Enlarged uterus with large exophytic uterine mass as well as right adnexal mass, unchanged from CT 2 weeks ago. Recommend correlation with prior workup. 3. Improved wall thickening of the ascending colon. Mild adjacent pericolonic and right lower quadrant edema persists. Colonic diverticulosis without diverticulitis. 4. Right pleural effusion has likely decreased in size, with moderate persisting effusion. 5. Left breast mass. 6.   Aortic Atherosclerosis (ICD10-I70.0). Electronically Signed   By: Keith Rake M.D.   On: 09/01/2018 21:17    Procedures Procedures (including critical care time)  Medications Ordered in ED Medications  sodium chloride 0.9 % bolus 1,000 mL (0 mLs Intravenous Stopped 09/01/18 1905)  ondansetron (ZOFRAN) injection 4 mg (4 mg Intravenous Given 09/01/18 1753)  sodium chloride 0.9 % bolus 1,000 mL (0 mLs Intravenous Stopped  09/01/18 2122)  iopamidol (ISOVUE-300) 61 % injection 100 mL (100 mLs Intravenous Contrast Given 09/01/18 2033)     Initial Impression / Assessment and Plan / ED Course  I have reviewed the triage vital signs and the nursing notes.  Pertinent labs & imaging results that were available during my care of the patient were reviewed by me and considered in my medical decision making (see chart for details).     Labs unremarkable except for elevated white count.  CT scan shows a mass around the uterus which has not changed but she has 2000 cc of urine in her bladder.  Patient had a Foley catheter placed and drained her bladder and she felt much better.  Patient prefers to be admitted to the hospital here.  Although she is scheduled for a biopsy on Thursday.  I suspect her white count is related to her distended bladder and she will be admitted to medicine if she improves she can then follow-up at [facility]  Final Clinical Impressions(s) / ED Diagnoses   Final diagnoses:  Urinary retention    ED Discharge Orders    None       Milton Ferguson, MD 09/01/18 2320

## 2018-09-01 NOTE — ED Triage Notes (Signed)
Patient reports ongoing abdominal pain, seen here 3 weeks ago and diagnosed with enlarged uterus and breast cancer. Patient has not voided since yesterday, no BM in a week.

## 2018-09-02 ENCOUNTER — Other Ambulatory Visit: Payer: Self-pay

## 2018-09-02 DIAGNOSIS — C5701 Malignant neoplasm of right fallopian tube: Secondary | ICD-10-CM | POA: Diagnosis not present

## 2018-09-02 DIAGNOSIS — D271 Benign neoplasm of left ovary: Secondary | ICD-10-CM | POA: Diagnosis not present

## 2018-09-02 DIAGNOSIS — R109 Unspecified abdominal pain: Secondary | ICD-10-CM | POA: Diagnosis not present

## 2018-09-02 DIAGNOSIS — Q430 Meckel's diverticulum (displaced) (hypertrophic): Secondary | ICD-10-CM | POA: Diagnosis not present

## 2018-09-02 DIAGNOSIS — N9489 Other specified conditions associated with female genital organs and menstrual cycle: Secondary | ICD-10-CM | POA: Diagnosis not present

## 2018-09-02 DIAGNOSIS — Z720 Tobacco use: Secondary | ICD-10-CM | POA: Diagnosis present

## 2018-09-02 DIAGNOSIS — E7800 Pure hypercholesterolemia, unspecified: Secondary | ICD-10-CM | POA: Diagnosis present

## 2018-09-02 DIAGNOSIS — D72829 Elevated white blood cell count, unspecified: Secondary | ICD-10-CM | POA: Diagnosis present

## 2018-09-02 DIAGNOSIS — D630 Anemia in neoplastic disease: Secondary | ICD-10-CM | POA: Diagnosis present

## 2018-09-02 DIAGNOSIS — J90 Pleural effusion, not elsewhere classified: Secondary | ICD-10-CM | POA: Diagnosis present

## 2018-09-02 DIAGNOSIS — D259 Leiomyoma of uterus, unspecified: Secondary | ICD-10-CM | POA: Diagnosis not present

## 2018-09-02 DIAGNOSIS — C50812 Malignant neoplasm of overlapping sites of left female breast: Secondary | ICD-10-CM | POA: Diagnosis not present

## 2018-09-02 DIAGNOSIS — D270 Benign neoplasm of right ovary: Secondary | ICD-10-CM | POA: Diagnosis not present

## 2018-09-02 DIAGNOSIS — R1900 Intra-abdominal and pelvic swelling, mass and lump, unspecified site: Secondary | ICD-10-CM | POA: Diagnosis present

## 2018-09-02 DIAGNOSIS — I10 Essential (primary) hypertension: Secondary | ICD-10-CM | POA: Diagnosis present

## 2018-09-02 DIAGNOSIS — Z91013 Allergy to seafood: Secondary | ICD-10-CM | POA: Diagnosis not present

## 2018-09-02 DIAGNOSIS — N852 Hypertrophy of uterus: Secondary | ICD-10-CM | POA: Diagnosis present

## 2018-09-02 DIAGNOSIS — N83512 Torsion of left ovary and ovarian pedicle: Secondary | ICD-10-CM | POA: Diagnosis not present

## 2018-09-02 DIAGNOSIS — I700 Atherosclerosis of aorta: Secondary | ICD-10-CM | POA: Diagnosis not present

## 2018-09-02 DIAGNOSIS — D282 Benign neoplasm of uterine tubes and ligaments: Secondary | ICD-10-CM | POA: Diagnosis not present

## 2018-09-02 DIAGNOSIS — N840 Polyp of corpus uteri: Secondary | ICD-10-CM | POA: Diagnosis not present

## 2018-09-02 DIAGNOSIS — Z881 Allergy status to other antibiotic agents status: Secondary | ICD-10-CM | POA: Diagnosis not present

## 2018-09-02 DIAGNOSIS — E441 Mild protein-calorie malnutrition: Secondary | ICD-10-CM | POA: Diagnosis not present

## 2018-09-02 DIAGNOSIS — C7961 Secondary malignant neoplasm of right ovary: Secondary | ICD-10-CM | POA: Diagnosis not present

## 2018-09-02 DIAGNOSIS — R339 Retention of urine, unspecified: Secondary | ICD-10-CM | POA: Diagnosis present

## 2018-09-02 DIAGNOSIS — Z9049 Acquired absence of other specified parts of digestive tract: Secondary | ICD-10-CM | POA: Diagnosis not present

## 2018-09-02 DIAGNOSIS — C50912 Malignant neoplasm of unspecified site of left female breast: Secondary | ICD-10-CM | POA: Diagnosis present

## 2018-09-02 DIAGNOSIS — F17210 Nicotine dependence, cigarettes, uncomplicated: Secondary | ICD-10-CM | POA: Diagnosis present

## 2018-09-02 LAB — CBC WITH DIFFERENTIAL/PLATELET
Abs Immature Granulocytes: 0.7 10*3/uL — ABNORMAL HIGH (ref 0.00–0.07)
Basophils Absolute: 0.1 10*3/uL (ref 0.0–0.1)
Basophils Relative: 0 %
Eosinophils Absolute: 0.2 10*3/uL (ref 0.0–0.5)
Eosinophils Relative: 1 %
HCT: 27.3 % — ABNORMAL LOW (ref 36.0–46.0)
Hemoglobin: 8.7 g/dL — ABNORMAL LOW (ref 12.0–15.0)
Immature Granulocytes: 4 %
Lymphocytes Relative: 10 %
Lymphs Abs: 1.5 10*3/uL (ref 0.7–4.0)
MCH: 29.9 pg (ref 26.0–34.0)
MCHC: 31.9 g/dL (ref 30.0–36.0)
MCV: 93.8 fL (ref 80.0–100.0)
Monocytes Absolute: 1 10*3/uL (ref 0.1–1.0)
Monocytes Relative: 6 %
Neutro Abs: 12.4 10*3/uL — ABNORMAL HIGH (ref 1.7–7.7)
Neutrophils Relative %: 79 %
Platelets: 411 10*3/uL — ABNORMAL HIGH (ref 150–400)
RBC: 2.91 MIL/uL — AB (ref 3.87–5.11)
RDW: 13.6 % (ref 11.5–15.5)
WBC: 15.9 10*3/uL — AB (ref 4.0–10.5)
nRBC: 0 % (ref 0.0–0.2)

## 2018-09-02 LAB — CBC
HCT: 27.1 % — ABNORMAL LOW (ref 36.0–46.0)
Hemoglobin: 8.6 g/dL — ABNORMAL LOW (ref 12.0–15.0)
MCH: 30.2 pg (ref 26.0–34.0)
MCHC: 31.7 g/dL (ref 30.0–36.0)
MCV: 95.1 fL (ref 80.0–100.0)
Platelets: 379 10*3/uL (ref 150–400)
RBC: 2.85 MIL/uL — ABNORMAL LOW (ref 3.87–5.11)
RDW: 13.5 % (ref 11.5–15.5)
WBC: 14.6 10*3/uL — AB (ref 4.0–10.5)
nRBC: 0 % (ref 0.0–0.2)

## 2018-09-02 LAB — COMPREHENSIVE METABOLIC PANEL
ALK PHOS: 85 U/L (ref 38–126)
ALT: 10 U/L (ref 0–44)
AST: 16 U/L (ref 15–41)
Albumin: 2.2 g/dL — ABNORMAL LOW (ref 3.5–5.0)
Anion gap: 6 (ref 5–15)
BUN: 13 mg/dL (ref 8–23)
CALCIUM: 7.9 mg/dL — AB (ref 8.9–10.3)
CO2: 20 mmol/L — ABNORMAL LOW (ref 22–32)
Chloride: 110 mmol/L (ref 98–111)
Creatinine, Ser: 0.7 mg/dL (ref 0.44–1.00)
GFR calc Af Amer: >60 mL/min (ref >60)
GFR calc non Af Amer: >60 mL/min (ref >60)
Glucose, Bld: 82 mg/dL (ref 70–99)
Potassium: 3.7 mmol/L (ref 3.5–5.1)
Sodium: 136 mmol/L (ref 135–145)
Total Bilirubin: 0.8 mg/dL (ref 0.3–1.2)
Total Protein: 5.7 g/dL — ABNORMAL LOW (ref 6.5–8.1)

## 2018-09-02 LAB — MAGNESIUM: Magnesium: 2 mg/dL (ref 1.7–2.4)

## 2018-09-02 MED ORDER — OXYCODONE-ACETAMINOPHEN 5-325 MG PO TABS
2.0000 | ORAL_TABLET | ORAL | Status: AC
  Administered 2018-09-02: 2 via ORAL
  Filled 2018-09-02: qty 2

## 2018-09-02 MED ORDER — ONDANSETRON HCL 4 MG/2ML IJ SOLN: 4.0000 mg | Freq: Four times a day (QID) | INTRAMUSCULAR | Status: DC | PRN

## 2018-09-02 MED ORDER — FENTANYL CITRATE (PF) 100 MCG/2ML IJ SOLN: 25.0000 ug | INTRAMUSCULAR | Status: DC | PRN

## 2018-09-02 MED ORDER — NICOTINE POLACRILEX 2 MG MT GUM
2.0000 mg | CHEWING_GUM | OROMUCOSAL | Status: DC | PRN
  Filled 2018-09-02: qty 1

## 2018-09-02 MED ORDER — SODIUM CHLORIDE 0.9 % IV SOLN
INTRAVENOUS | Status: DC
  Administered 2018-09-02 (×2): via INTRAVENOUS

## 2018-09-02 MED ORDER — OXYCODONE HCL 5 MG PO TABS: 5.0000 mg | ORAL_TABLET | Freq: Four times a day (QID) | ORAL | Status: DC | PRN

## 2018-09-02 MED ORDER — METOCLOPRAMIDE HCL 10 MG PO TABS
10.0000 mg | ORAL_TABLET | Freq: Four times a day (QID) | ORAL | Status: DC
  Administered 2018-09-02 – 2018-09-04 (×6): 10 mg via ORAL
  Filled 2018-09-02 (×6): qty 1

## 2018-09-02 MED ORDER — ONDANSETRON HCL 4 MG PO TABS: 4.0000 mg | ORAL_TABLET | Freq: Four times a day (QID) | ORAL | Status: DC | PRN

## 2018-09-02 MED ORDER — FENTANYL CITRATE (PF) 100 MCG/2ML IJ SOLN: 50.0000 ug | INTRAMUSCULAR | Status: DC | PRN

## 2018-09-02 MED ORDER — NICOTINE POLACRILEX 2 MG MT LOZG
2.0000 mg | LOZENGE | OROMUCOSAL | Status: DC | PRN
  Filled 2018-09-02: qty 1

## 2018-09-02 MED ORDER — ENSURE ENLIVE PO LIQD
237.0000 mL | Freq: Two times a day (BID) | ORAL | Status: DC
  Administered 2018-09-03: 237 mL via ORAL

## 2018-09-02 MED ORDER — OXYCODONE-ACETAMINOPHEN 5-325 MG PO TABS
2.0000 | ORAL_TABLET | ORAL | Status: DC | PRN
  Administered 2018-09-02: 2 via ORAL
  Filled 2018-09-02: qty 2

## 2018-09-02 MED ORDER — ACETAMINOPHEN 500 MG PO TABS
1000.0000 mg | ORAL_TABLET | Freq: Four times a day (QID) | ORAL | Status: DC | PRN
  Administered 2018-09-03: 1000 mg via ORAL
  Filled 2018-09-02: qty 2

## 2018-09-02 NOTE — ED Notes (Signed)
Pt's oxygen saturation drops to 89% when sleeping. Pt placed on 2L nasal cannula.

## 2018-09-02 NOTE — Progress Notes (Signed)
PROGRESS NOTE    DIASIA HENKEN  KYH:[id number] DOB: [birth year] DOA: 09/01/2018 PCP: Ginger Organ    Brief Narrative:  68 year old female with a history of recently diagnosed breast cancer, uterine mass which she is undergoing work-up at [facility], presented to the hospital abdominal pain and found to have substantial urinary retention and abdominal discomfort.  Foley catheter was placed and she expelled 2 L of urine.  Continues to have abdominal discomfort for which she was admitted and medications are being adjusted.   Assessment & Plan:   Principal Problem:   Urinary retention Active Problems:   Pelvic mass   Leukocytosis   Abdominal pain   Tobacco abuse   1. Urinary retention.  Patient presented with significant abdominal pressure and was found to have markedly urinary retention.  Foley catheter was placed and she expelled approximately 2 L of urine.  Overall abdominal pressure has improved.  Will need to leave catheter in for now and have her follow-up with urology. 2. Abdominal pain.  I suspect this is related to her underlying cancer.  CT of the abdomen and pelvis did not show any other acute findings.  Overall thickening of the ascending colon had improved when compared to prior imaging.  Continue to adjust pain medicine. 3. Pelvic mass.  Patient found to have a large right adnexal mass for which she will follow-up at [facility] on 1/30 for biopsy/removal. 4. Left breast mass.  Recently diagnosed triple negative breast cancer.  She is being treated for this at [facility].  Plan is for Port-A-Cath placement as well as chemotherapy in the near future. 5. Anemia.  Unclear etiology.  Possibly related to her underlying malignancy.  Patient does not report any obvious bleeding.  We will check stool for occult blood and anemia panel. 6. Hypertension.  She is chronically on lisinopril and hydrochlorothiazide.  She reports that these medications were recently discontinued  when her blood pressures are noted to be low.  Current blood pressures are stable. 7. Leukocytosis.  Suspect this is reactive to underlying urinary retention and malignancy.  No signs of infection at this time.  Continue to monitor. 8. Right pleural effusion.  Recently underwent thoracentesis at [facility].  She continues to have a significant effusion, but does not feel short of breath at this time.  Will not perform further thoracentesis since she appears to be asymptomatic.   DVT prophylaxis: SCDs Code Status: Full code Family Communication: No family present Disposition Plan: Discharge home once abdominal pain has stabilized   Consultants:     Procedures:     Antimicrobials:       Subjective: Continues to have abdominal pain.  Feels that her pain does get worse after she eats or drinks.  No diarrhea.  She has had nausea and vomiting.  Objective: Vitals:   09/02/18 1230 09/02/18 1300 09/02/18 1516 09/02/18 1622  BP: (!) 114/59 121/68 (!) 114/56 (!) 111/59  Pulse: 98 (!) 109 (!) 113 (!) 116  Resp: 18 (!) 21 20 18   Temp:   99.4 F (37.4 C)   TempSrc:   Oral   SpO2: 96% 96% 96% 98%  Weight:      Height:        Intake/Output Summary (Last 24 hours) at 09/02/2018 1910 Last data filed at 09/02/2018 0640 Gross per 24 hour  Intake 1000 ml  Output 1800 ml  Net -800 ml   Filed Weights   09/01/18 1702  Weight: 97.1 kg  Examination:  General exam: Appears calm and comfortable  Respiratory system: Diminished breath sounds at right base. Respiratory effort normal. Cardiovascular system: S1 & S2 heard, RRR. No JVD, murmurs, rubs, gallops or clicks. No pedal edema. Gastrointestinal system: Abdomen is nondistended, soft and nontender. No organomegaly or masses felt. Normal bowel sounds heard. Central nervous system: Alert and oriented. No focal neurological deficits. Extremities: Symmetric 5 x 5 power. Skin: No rashes, lesions or ulcers Psychiatry: Judgement and  insight appear normal. Mood & affect appropriate.     Data Reviewed: I have personally reviewed following labs and imaging studies  CBC: Recent Labs  Lab 09/01/18 1742 09/02/18 0521 09/02/18 1204  WBC 20.3* 15.9* 14.6*  NEUTROABS 16.4* 12.4*  --   HGB 10.6* 8.7* 8.6*  HCT 34.0* 27.3* 27.1*  MCV 95.5 93.8 95.1  PLT 492* 411* 573   Basic Metabolic Panel: Recent Labs  Lab 09/01/18 1742 09/02/18 0521  NA 136 136  K 3.7 3.7  CL 105 110  CO2 21* 20*  GLUCOSE 115* 82  BUN 16 13  CREATININE 0.85 0.70  CALCIUM 8.7* 7.9*  MG  --  2.0   GFR: Estimated Creatinine Clearance: 77.2 mL/min (by C-G formula based on SCr of 0.7 mg/dL). Liver Function Tests: Recent Labs  Lab 09/01/18 1742 09/02/18 0521  AST 22 16  ALT 13 10  ALKPHOS 98 85  BILITOT 1.0 0.8  PROT 7.1 5.7*  ALBUMIN 2.8* 2.2*   No results for input(s): LIPASE, AMYLASE in the last 168 hours. No results for input(s): AMMONIA in the last 168 hours. Coagulation Profile: No results for input(s): INR, PROTIME in the last 168 hours. Cardiac Enzymes: No results for input(s): CKTOTAL, CKMB, CKMBINDEX, TROPONINI in the last 168 hours. BNP (last 3 results) No results for input(s): PROBNP in the last 8760 hours. HbA1C: No results for input(s): HGBA1C in the last 72 hours. CBG: No results for input(s): GLUCAP in the last 168 hours. Lipid Profile: No results for input(s): CHOL, HDL, LDLCALC, TRIG, CHOLHDL, LDLDIRECT in the last 72 hours. Thyroid Function Tests: No results for input(s): TSH, T4TOTAL, FREET4, T3FREE, THYROIDAB in the last 72 hours. Anemia Panel: No results for input(s): VITAMINB12, FOLATE, FERRITIN, TIBC, IRON, RETICCTPCT in the last 72 hours. Sepsis Labs: No results for input(s): PROCALCITON, LATICACIDVEN in the last 168 hours.  No results found for this or any previous visit (from the past 240 hour(s)).       Radiology Studies: Dg Chest 1 View  Result Date: 09/01/2018 CLINICAL DATA:   68 year old female with a history of abdominal pain EXAM: CHEST  1 VIEW COMPARISON:  08/19/2018 FINDINGS: Cardiomediastinal silhouette unchanged in size and contour. Persistent obscuration the right hemidiaphragm and the right heart border by dense opacity at the right lung base, layer to the apex. Pleuroparenchymal thickening at the right apex. Left lung relatively well aerated with coarsened interstitial markings. IMPRESSION: Right-sided pleural effusion, similar to the comparison with obscuration the right heart border and the right hemidiaphragm. Associated atelectasis/consolidation. Left lung relatively well aerated. Electronically Signed   By: Corrie Mckusick D.O.   On: 09/01/2018 21:28   Ct Abdomen Pelvis W Contrast  Result Date: 09/01/2018 CLINICAL DATA:  Abdominal distension. Ongoing abdominal pain. Patient reports she has not voided since yesterday. No bowel movement in 1 week. EXAM: CT ABDOMEN AND PELVIS WITH CONTRAST TECHNIQUE: Multidetector CT imaging of the abdomen and pelvis was performed using the standard protocol following bolus administration of intravenous contrast. CONTRAST:  152mL ISOVUE-300  IOPAMIDOL (ISOVUE-300) INJECTION 61% COMPARISON:  Abdominal CT 08/19/2018 FINDINGS: Lower chest: Right pleural effusion is likely decreased in size, persistent moderate volume of pleural fluid. Associated atelectasis of the right lower and right middle lobes. Lobulated 5.3 cm left breast mass. Adjacent 2.6 cm rounded density in the lower left chest wall soft tissues. Hepatobiliary: No focal hepatic lesion. Postcholecystectomy with unchanged biliary prominence, common bile duct measures 12 mm at the porta hepatis. No visualized choledocholithiasis. Pancreas: No ductal dilatation or inflammation. Spleen: Normal in size without focal abnormality. Adrenals/Urinary Tract: No adrenal nodule. No hydronephrosis. Mild perinephric edema, slightly progressed from prior exam. Simple cysts in the right kidney.  Homogeneous renal enhancement with symmetric excretion on delayed phase imaging. Bladder distention to the level of the umbilicus, bladder volume 19.3 x 14.7 x 13.4 cm (volume = 1990 cm^3). No bladder wall thickening. Stomach/Bowel: Lack of enteric contrast limits bowel assessment. The stomach is nondistended. No small bowel obstruction. Wall thickening of the ascending colon has slightly improved from prior exam. Pericolonic/right lower quadrant haziness persists. There is sigmoid colonic redundancy. Distal colonic diverticulosis without diverticulitis. Small to moderate colonic stool burden. No free air. Vascular/Lymphatic: Aortic atherosclerosis. No aneurysm. Multiple small retroperitoneal nodes largest 7 mm. No definite pelvic adenopathy. Reproductive: Enlarged uterus with uterine calcifications. Large soft tissue mass in the right pelvis appears contiguous with enlarged uterus, unchanged in size from prior exam but no deviated more to the right. Background enlarged uterus with multiple calcifications. Complex right adnexal mass measuring 4.8 x 3.6 cm is again seen. Other: Stranding/edema in the right pelvis small amount of free fluid in the right pericolic gutter, similar to prior exam. Musculoskeletal: Degenerative change in the spine. No suspicious bone lesion. IMPRESSION: 1. Distended urinary bladder with bladder volume of nearly 2000 cc. 2. Enlarged uterus with large exophytic uterine mass as well as right adnexal mass, unchanged from CT 2 weeks ago. Recommend correlation with prior workup. 3. Improved wall thickening of the ascending colon. Mild adjacent pericolonic and right lower quadrant edema persists. Colonic diverticulosis without diverticulitis. 4. Right pleural effusion has likely decreased in size, with moderate persisting effusion. 5. Left breast mass. 6.  Aortic Atherosclerosis (ICD10-I70.0). Electronically Signed   By: Keith Rake M.D.   On: 09/01/2018 21:17        Scheduled  Meds: . [START ON 09/03/2018] feeding supplement (ENSURE ENLIVE)  237 mL Oral BID BM  . metoCLOPramide  10 mg Oral Q6H   Continuous Infusions: . sodium chloride 100 mL/hr at 09/02/18 1905     LOS: 0 days    Time spent: 72mins    Kathie Dike, MD [facility]   If 7PM-7AM, please contact night-coverage [email or web address]  09/02/2018, 7:10 PM

## 2018-09-02 NOTE — ED Notes (Signed)
NT at bedside about to walk patient.

## 2018-09-02 NOTE — Care Management Obs Status (Signed)
MEDICARE OBSERVATION STATUS NOTIFICATION   Patient Details  Name: ROBERTHA STAPLES MRN: [id number] Date of Birth: May 06, 1951   Medicare Observation Status Notification Given:  Yes    Caylei Sperry, Chauncey Reading, RN 09/02/2018, 2:42 PM

## 2018-09-02 NOTE — ED Notes (Signed)
Pt given breakfast meal tray. 

## 2018-09-03 LAB — IRON AND TIBC
Iron: 12 ug/dL — ABNORMAL LOW (ref 28–170)
SATURATION RATIOS: 7 % — AB (ref 10.4–31.8)
TIBC: 160 ug/dL — ABNORMAL LOW (ref 250–450)
UIBC: 148 ug/dL

## 2018-09-03 LAB — CBC
HCT: 27.1 % — ABNORMAL LOW (ref 36.0–46.0)
Hemoglobin: 8.4 g/dL — ABNORMAL LOW (ref 12.0–15.0)
MCH: 29.6 pg (ref 26.0–34.0)
MCHC: 31 g/dL (ref 30.0–36.0)
MCV: 95.4 fL (ref 80.0–100.0)
Platelets: 405 10*3/uL — ABNORMAL HIGH (ref 150–400)
RBC: 2.84 MIL/uL — ABNORMAL LOW (ref 3.87–5.11)
RDW: 13.5 % (ref 11.5–15.5)
WBC: 13.4 10*3/uL — ABNORMAL HIGH (ref 4.0–10.5)
nRBC: 0 % (ref 0.0–0.2)

## 2018-09-03 LAB — VITAMIN B12: Vitamin B-12: 308 pg/mL (ref 180–914)

## 2018-09-03 LAB — RETICULOCYTES
Immature Retic Fract: 17.2 % — ABNORMAL HIGH (ref 2.3–15.9)
RBC.: 2.31 MIL/uL — ABNORMAL LOW (ref 3.87–5.11)
Retic Count, Absolute: 31.6 10*3/uL (ref 19.0–186.0)
Retic Ct Pct: 1.4 % (ref 0.4–3.1)

## 2018-09-03 LAB — FOLATE: Folate: 9.4 ng/mL (ref >5.9)

## 2018-09-03 LAB — HIV ANTIBODY (ROUTINE TESTING W REFLEX): HIV Screen 4th Generation wRfx: NONREACTIVE

## 2018-09-03 LAB — FERRITIN: Ferritin: 543 ng/mL — ABNORMAL HIGH (ref 11–307)

## 2018-09-03 MED ORDER — DICYCLOMINE HCL 10 MG PO CAPS
10.0000 mg | ORAL_CAPSULE | Freq: Three times a day (TID) | ORAL | Status: DC
  Administered 2018-09-03 – 2018-09-04 (×2): 10 mg via ORAL
  Filled 2018-09-03 (×2): qty 1

## 2018-09-03 MED ORDER — OXYCODONE-ACETAMINOPHEN 5-325 MG PO TABS
2.0000 | ORAL_TABLET | ORAL | Status: DC | PRN
  Administered 2018-09-03: 2 via ORAL
  Filled 2018-09-03: qty 2

## 2018-09-03 MED ORDER — SORBITOL 70 % SOLN
960.0000 mL | TOPICAL_OIL | Freq: Once | ORAL | Status: AC
  Administered 2018-09-03: 960 mL via RECTAL
  Filled 2018-09-03: qty 473

## 2018-09-03 NOTE — Progress Notes (Signed)
PROGRESS NOTE    Sierra Roberts  BMW:[id number] DOB: 1950-08-30 DOA: 09/01/2018 PCP: Ginger Organ    Brief Narrative:  68 year old female with a history of recently diagnosed breast cancer, uterine mass which she is undergoing work-up at [facility], presented to the hospital abdominal pain and found to have substantial urinary retention and abdominal discomfort.  Foley catheter was placed and she expelled 2 L of urine.  Continues to have abdominal discomfort for which she was admitted and medications are being adjusted.   Assessment & Plan:   Principal Problem:   Urinary retention Active Problems:   Pelvic mass   Leukocytosis   Abdominal pain   Tobacco abuse   1. Urinary retention.  Patient presented with significant abdominal pressure and was found to have markedly urinary retention.  Foley catheter was placed and she expelled approximately 2 L of urine.  Overall abdominal pressure has improved.  Will need to leave catheter in for now and have her follow-up with urology.  Unfortunately, patient asked nursing staff to remove catheter today.  This is been removed.  I counseled the patient on the importance of having the catheter since her bladder will likely not be able to empty adequately for the next several weeks and she is at risk of developing urinary retention again.  The patient does not want to have this replaced at this time.  I have discussed this with her husband who will try to convince her to have the catheter replaced. 2. Abdominal pain.  I suspect this is related to her underlying cancer.  CT of the abdomen and pelvis did not show any other acute findings.  Overall thickening of the ascending colon had improved when compared to prior imaging.  Continue to adjust pain medicine. 3. Pelvic mass.  Patient found to have a large right adnexal mass for which she will follow-up at [facility] on 1/30 for biopsy/removal. 4. Left breast mass.  Recently diagnosed triple  negative breast cancer.  She is being treated for this at [facility].  Plan is for Port-A-Cath placement as well as chemotherapy in the near future. 5. Anemia.  Unclear etiology.  Possibly related to her underlying malignancy.  She reports noticing small amounts of blood in her stool which she attributes to hemorrhoids.  She is chronically constipated and is frequently straining. Will check anemia panel. 6. Hypertension.  She is chronically on lisinopril and hydrochlorothiazide.  She reports that these medications were recently discontinued when her blood pressures are noted to be low.  Current blood pressures are stable. 7. Leukocytosis.  Suspect this is reactive to underlying urinary retention and malignancy.  No signs of infection at this time.  This is trended down with IV fluids.  Continue to monitor. 8. Right pleural effusion.  Recently underwent thoracentesis at [facility].  She continues to have a significant effusion, but does not feel short of breath at this time.  She is not interested in thoracentesis.  Will not perform further thoracentesis at this time since she appears to be asymptomatic.  She may need to have this done in the future   DVT prophylaxis: SCDs Code Status: Full code Family Communication: Discussed with husband at the bedside Disposition Plan: Discharge home once abdominal pain has stabilized   Consultants:     Procedures:     Antimicrobials:       Subjective: Abdominal pain was initially better today than it was yesterday.  She reports not having a bowel  movement in a week.  She was straining earlier today and noticed a little bit of blood which she attributes to hemorrhoids.  She reports some p.o. intake, but gets full very fast.  Abdomen feels full.  After receiving an enema, patient did have a medium bowel movement, but reports significant abdominal pain at this time.  She persistently asked nursing staff to remove her Foley catheter, which was  removed.  Objective: Vitals:   09/02/18 2156 09/03/18 0500 09/03/18 0554 09/03/18 1436  BP: (!) 106/57  (!) 127/99 110/64  Pulse: (!) 114  (!) 106 100  Resp: 18  18 18   Temp: (!) 100.5 F (38.1 C)  (!) 100.4 F (38 C)   TempSrc: Oral  Oral   SpO2: 92%  93% 93%  Weight:  96.3 kg    Height:        Intake/Output Summary (Last 24 hours) at 09/03/2018 1922 Last data filed at 09/03/2018 1700 Gross per 24 hour  Intake 480 ml  Output 1250 ml  Net -770 ml   Filed Weights   09/01/18 1702 09/03/18 0500  Weight: 97.1 kg 96.3 kg    Examination:  General exam: Appears calm and comfortable  Respiratory system: Diminished breath sounds on the right side. respiratory effort normal. Cardiovascular system: S1 & S2 heard, RRR. No JVD, murmurs, rubs, gallops or clicks. No pedal edema. Gastrointestinal system: Abdomen is soft, full, tender on the right side. No organomegaly or masses felt. Normal bowel sounds heard. Central nervous system: Alert and oriented. No focal neurological deficits. Extremities: Symmetric 5 x 5 power. Skin: No rashes, lesions or ulcers Psychiatry: Judgement and insight appear normal. Mood & affect appropriate.     Data Reviewed: I have personally reviewed following labs and imaging studies  CBC: Recent Labs  Lab 09/01/18 1742 09/02/18 0521 09/02/18 1204 09/03/18 0656  WBC 20.3* 15.9* 14.6* 13.4*  NEUTROABS 16.4* 12.4*  --   --   HGB 10.6* 8.7* 8.6* 8.4*  HCT 34.0* 27.3* 27.1* 27.1*  MCV 95.5 93.8 95.1 95.4  PLT 492* 411* 379 007*   Basic Metabolic Panel: Recent Labs  Lab 09/01/18 1742 09/02/18 0521  NA 136 136  K 3.7 3.7  CL 105 110  CO2 21* 20*  GLUCOSE 115* 82  BUN 16 13  CREATININE 0.85 0.70  CALCIUM 8.7* 7.9*  MG  --  2.0   GFR: Estimated Creatinine Clearance: 76.8 mL/min (by C-G formula based on SCr of 0.7 mg/dL). Liver Function Tests: Recent Labs  Lab 09/01/18 1742 09/02/18 0521  AST 22 16  ALT 13 10  ALKPHOS 98 85  BILITOT 1.0  0.8  PROT 7.1 5.7*  ALBUMIN 2.8* 2.2*   No results for input(s): LIPASE, AMYLASE in the last 168 hours. No results for input(s): AMMONIA in the last 168 hours. Coagulation Profile: No results for input(s): INR, PROTIME in the last 168 hours. Cardiac Enzymes: No results for input(s): CKTOTAL, CKMB, CKMBINDEX, TROPONINI in the last 168 hours. BNP (last 3 results) No results for input(s): PROBNP in the last 8760 hours. HbA1C: No results for input(s): HGBA1C in the last 72 hours. CBG: No results for input(s): GLUCAP in the last 168 hours. Lipid Profile: No results for input(s): CHOL, HDL, LDLCALC, TRIG, CHOLHDL, LDLDIRECT in the last 72 hours. Thyroid Function Tests: No results for input(s): TSH, T4TOTAL, FREET4, T3FREE, THYROIDAB in the last 72 hours. Anemia Panel: No results for input(s): VITAMINB12, FOLATE, FERRITIN, TIBC, IRON, RETICCTPCT in the last 72 hours. Sepsis  Labs: No results for input(s): PROCALCITON, LATICACIDVEN in the last 168 hours.  No results found for this or any previous visit (from the past 240 hour(s)).       Radiology Studies: Dg Chest 1 View  Result Date: 09/01/2018 CLINICAL DATA:  68 year old female with a history of abdominal pain EXAM: CHEST  1 VIEW COMPARISON:  08/19/2018 FINDINGS: Cardiomediastinal silhouette unchanged in size and contour. Persistent obscuration the right hemidiaphragm and the right heart border by dense opacity at the right lung base, layer to the apex. Pleuroparenchymal thickening at the right apex. Left lung relatively well aerated with coarsened interstitial markings. IMPRESSION: Right-sided pleural effusion, similar to the comparison with obscuration the right heart border and the right hemidiaphragm. Associated atelectasis/consolidation. Left lung relatively well aerated. Electronically Signed   By: Corrie Mckusick D.O.   On: 09/01/2018 21:28   Ct Abdomen Pelvis W Contrast  Result Date: 09/01/2018 CLINICAL DATA:  Abdominal  distension. Ongoing abdominal pain. Patient reports she has not voided since yesterday. No bowel movement in 1 week. EXAM: CT ABDOMEN AND PELVIS WITH CONTRAST TECHNIQUE: Multidetector CT imaging of the abdomen and pelvis was performed using the standard protocol following bolus administration of intravenous contrast. CONTRAST:  123mL ISOVUE-300 IOPAMIDOL (ISOVUE-300) INJECTION 61% COMPARISON:  Abdominal CT 08/19/2018 FINDINGS: Lower chest: Right pleural effusion is likely decreased in size, persistent moderate volume of pleural fluid. Associated atelectasis of the right lower and right middle lobes. Lobulated 5.3 cm left breast mass. Adjacent 2.6 cm rounded density in the lower left chest wall soft tissues. Hepatobiliary: No focal hepatic lesion. Postcholecystectomy with unchanged biliary prominence, common bile duct measures 12 mm at the porta hepatis. No visualized choledocholithiasis. Pancreas: No ductal dilatation or inflammation. Spleen: Normal in size without focal abnormality. Adrenals/Urinary Tract: No adrenal nodule. No hydronephrosis. Mild perinephric edema, slightly progressed from prior exam. Simple cysts in the right kidney. Homogeneous renal enhancement with symmetric excretion on delayed phase imaging. Bladder distention to the level of the umbilicus, bladder volume 19.3 x 14.7 x 13.4 cm (volume = 1990 cm^3). No bladder wall thickening. Stomach/Bowel: Lack of enteric contrast limits bowel assessment. The stomach is nondistended. No small bowel obstruction. Wall thickening of the ascending colon has slightly improved from prior exam. Pericolonic/right lower quadrant haziness persists. There is sigmoid colonic redundancy. Distal colonic diverticulosis without diverticulitis. Small to moderate colonic stool burden. No free air. Vascular/Lymphatic: Aortic atherosclerosis. No aneurysm. Multiple small retroperitoneal nodes largest 7 mm. No definite pelvic adenopathy. Reproductive: Enlarged uterus with  uterine calcifications. Large soft tissue mass in the right pelvis appears contiguous with enlarged uterus, unchanged in size from prior exam but no deviated more to the right. Background enlarged uterus with multiple calcifications. Complex right adnexal mass measuring 4.8 x 3.6 cm is again seen. Other: Stranding/edema in the right pelvis small amount of free fluid in the right pericolic gutter, similar to prior exam. Musculoskeletal: Degenerative change in the spine. No suspicious bone lesion. IMPRESSION: 1. Distended urinary bladder with bladder volume of nearly 2000 cc. 2. Enlarged uterus with large exophytic uterine mass as well as right adnexal mass, unchanged from CT 2 weeks ago. Recommend correlation with prior workup. 3. Improved wall thickening of the ascending colon. Mild adjacent pericolonic and right lower quadrant edema persists. Colonic diverticulosis without diverticulitis. 4. Right pleural effusion has likely decreased in size, with moderate persisting effusion. 5. Left breast mass. 6.  Aortic Atherosclerosis (ICD10-I70.0). Electronically Signed   By: Keith Rake M.D.   On: 09/01/2018  21:17        Scheduled Meds: . dicyclomine  10 mg Oral TID AC  . metoCLOPramide  10 mg Oral Q6H   Continuous Infusions:    LOS: 1 day    Time spent: 74mins    Kathie Dike, MD [facility]   If 7PM-7AM, please contact night-coverage [email or web address]  09/03/2018, 7:22 PM

## 2018-09-03 NOTE — Progress Notes (Signed)
Nutrition Brief Note  Patient identified on the Malnutrition Screening Tool (MST) Report. She had reported unintentional weight loss and poor intake in setting of decreased appetite.   Pt seen at lunch time. She reports decreased appetite x3 weeks, which is when her abdominal pain started. She believes she has been eating <50% of her normal and that she has lost from a UBW of 225 lbs to 212 lbs. Her wt history is not consistent with objective records, which show she has gained weight in the past 2 weeks. She was 206 lb 5 oz when seen in [facility] ED 1/14 and tx to [facility]. She is 212.3 lbs now.   She has Ensure on her tray table. She notes she much prefers Boost. Given Boost Plus not on formulary, RD offered Vital Shake- which she accepted.   Pt notes she has a biopsy scheduled for tomorrow that she absolutely does not want to miss. RD responded that, per progression round, MD had said if her pain is controlled he had anticipated D/C. He will be around to discuss this with her.   In light of wt stability and likely imminent D/C, deferred full assessment.   No nutrition interventions warranted at this time. If nutrition issues arise, please consult RD.   Burtis Junes RD, LDN, CNSC Clinical Nutrition Available Tues-Sat via Pager: [phone] 09/03/2018 12:22 PM

## 2018-09-04 DIAGNOSIS — N858 Other specified noninflammatory disorders of uterus: Secondary | ICD-10-CM | POA: Diagnosis present

## 2018-09-04 DIAGNOSIS — F17210 Nicotine dependence, cigarettes, uncomplicated: Secondary | ICD-10-CM | POA: Diagnosis present

## 2018-09-04 DIAGNOSIS — N852 Hypertrophy of uterus: Secondary | ICD-10-CM | POA: Diagnosis present

## 2018-09-04 DIAGNOSIS — I700 Atherosclerosis of aorta: Secondary | ICD-10-CM | POA: Diagnosis present

## 2018-09-04 DIAGNOSIS — N739 Female pelvic inflammatory disease, unspecified: Secondary | ICD-10-CM | POA: Diagnosis present

## 2018-09-04 DIAGNOSIS — E785 Hyperlipidemia, unspecified: Secondary | ICD-10-CM | POA: Diagnosis present

## 2018-09-04 DIAGNOSIS — D282 Benign neoplasm of uterine tubes and ligaments: Secondary | ICD-10-CM | POA: Diagnosis present

## 2018-09-04 DIAGNOSIS — N281 Cyst of kidney, acquired: Secondary | ICD-10-CM | POA: Diagnosis present

## 2018-09-04 DIAGNOSIS — K5730 Diverticulosis of large intestine without perforation or abscess without bleeding: Secondary | ICD-10-CM | POA: Diagnosis present

## 2018-09-04 DIAGNOSIS — M47819 Spondylosis without myelopathy or radiculopathy, site unspecified: Secondary | ICD-10-CM | POA: Diagnosis present

## 2018-09-04 DIAGNOSIS — C50812 Malignant neoplasm of overlapping sites of left female breast: Secondary | ICD-10-CM | POA: Diagnosis present

## 2018-09-04 DIAGNOSIS — Z9049 Acquired absence of other specified parts of digestive tract: Secondary | ICD-10-CM | POA: Diagnosis not present

## 2018-09-04 DIAGNOSIS — I10 Essential (primary) hypertension: Secondary | ICD-10-CM | POA: Diagnosis present

## 2018-09-04 DIAGNOSIS — E441 Mild protein-calorie malnutrition: Secondary | ICD-10-CM | POA: Diagnosis present

## 2018-09-04 DIAGNOSIS — C7961 Secondary malignant neoplasm of right ovary: Secondary | ICD-10-CM | POA: Diagnosis present

## 2018-09-04 DIAGNOSIS — C5701 Malignant neoplasm of right fallopian tube: Secondary | ICD-10-CM | POA: Diagnosis present

## 2018-09-04 DIAGNOSIS — Z6836 Body mass index (BMI) 36.0-36.9, adult: Secondary | ICD-10-CM | POA: Diagnosis not present

## 2018-09-04 DIAGNOSIS — K429 Umbilical hernia without obstruction or gangrene: Secondary | ICD-10-CM | POA: Diagnosis present

## 2018-09-04 MED ORDER — DICYCLOMINE HCL 10 MG PO CAPS: 10.0000 mg | ORAL_CAPSULE | Freq: Three times a day (TID) | ORAL | 0 refills | Status: DC

## 2018-09-04 NOTE — Progress Notes (Signed)
Removed IV-clean, dry, intact. Stable patient and belongings were wheeled to main entrance where she was picked up by husband.

## 2018-09-04 NOTE — Progress Notes (Deleted)
Physician Discharge Summary  Sierra Roberts CHE:[id number] DOB: Jan 26, 1951 DOA: 09/01/2018  PCP: Cory Munch, PA-C  Admit date: 09/01/2018 Discharge date: 09/04/2018  Admitted From: home Disposition:  home  Recommendations for Outpatient Follow-up:  1. Patient will be discharged today 2. She will travel straight to [place] for scheduled biopsy/removal of pelvic mass 3. Would recommend that urinary catheter be reinserted at [facility] for urinary retention 4. Recommend further work up for anemia  Discharge Condition:stable CODE STATUS: full code Diet recommendation: regular diet  Brief/Interim Summary: 68 year old female who was recently diagnosed with invasive ductal breast carcinoma and has a uterine mass which she is undergoing work-up for a [facility], presents to the hospital with abdominal pain.  She was found to have substantial urinary retention and abdominal discomfort.  Foley catheter was placed in the emergency room and she expelled 2 L of urine.  CT scan of the abdomen pelvis showed enlarged bladder, but did not show any other acute findings.  Previously documented thickening of ascending colon has shown improvement on current CT.  She did have a chest x-ray that showed pleural effusion.  No evidence of pneumonia.  She did not complain of shortness of breath or cough.  Urinalysis did not show any signs of infection.  She did have significant constipation and reported that she had not had a bowel movement in a week.  CT did not show any evidence of bowel obstruction.  Patient did receive an enema and had a medium bowel movement.  She was monitored in the hospital and treated for abdominal pain.  I suspect that her pain may be related to her pelvic mass.  At this point, pain appears to be reasonably controlled.  She did have a significant leukocytosis which improved with IV fluids.  She has a low-grade fever of 100.2, but does not appear septic or toxic.  This may be related to  atelectasis from pleural effusion.  She was also noted to have significant anemia when compared to prior labs.  She has not noticed any gross bleeding.  She does report some blood with straining for bowel movements which she attributes to hemorrhoids.  Anemia panel indicates mix of iron deficiency as well as chronic disease.  This can be further worked up as an outpatient.  Her hemoglobin has been stable in the hospital and she has not required transfusion.  After monitoring in the hospital, patient reports that her abdominal pain is stable.  She requested that her Foley catheter be removed.  It was explained to her the importance of maintaining bladder decompression since she had significant urinary retention.  Patient was very adamant about having catheter removed and says she will have this readdressed when she goes to [facility].  Would recommend that catheter be replaced at [facility] since she will likely have difficulty emptying her bladder for the next several weeks.  She is expelling small amounts (100 to 200 cc of urine) at the time, but is likely incompletely voiding and will develop significant urinary retention.  Patient does have a procedure scheduled today at [facility] for biopsy/removal of pelvic mass.  She wishes to discharge today and go straight there.  She does not want to stay in the hospital for any further work-up of anemia/low-grade fever.  She reports that she will address this over [facility].  At this time, patient does not appear to be septic.  She is felt stable for discharge.  Discharge Diagnoses:  Principal Problem:  Urinary retention Active Problems:   Pelvic mass   Leukocytosis   Abdominal pain   Tobacco abuse    Discharge Instructions  Discharge Instructions    Diet - low sodium heart healthy   Complete by:  As directed    Increase activity slowly   Complete by:  As directed      Allergies as of 09/04/2018      Reactions   Clams [shellfish Allergy] Diarrhea,  Nausea And Vomiting   Flagyl [metronidazole] Other (See Comments)   Numbness       Medication List    STOP taking these medications   lisinopril-hydrochlorothiazide 20-12.5 MG tablet Commonly known as:  PRINZIDE,ZESTORETIC     TAKE these medications   acetaminophen 500 MG tablet Commonly known as:  TYLENOL Take 1,000 mg by mouth every 6 (six) hours as needed for mild pain or moderate pain.   COMMIT MT Use as directed 1 lozenge in the mouth or throat daily as needed (for cessation).   dicyclomine 10 MG capsule Commonly known as:  BENTYL Take 1 capsule (10 mg total) by mouth 3 (three) times daily before meals.   metoCLOPramide 10 MG tablet Commonly known as:  REGLAN Take 10 mg by mouth every 6 (six) hours.   oxycodone 5 MG capsule Commonly known as:  OXY-IR Take 5 mg by mouth every 6 (six) hours as needed for pain.   simvastatin 20 MG tablet Commonly known as:  ZOCOR Take 20 mg by mouth daily.      Follow-up Information    [facility] On 09/19/2018.   Why:  Appointment Feb. 14, 2020 at 9:45am. please arrive at 9:15 to register Contact information: [address]  [place] [address] [phone]         Allergies  Allergen Reactions  . Clams [Shellfish Allergy] Diarrhea and Nausea And Vomiting  . Flagyl [Metronidazole] Other (See Comments)    Numbness     Consultations:     Procedures/Studies: Dg Chest 1 View  Result Date: 09/01/2018 CLINICAL DATA:  68 year old female with a history of abdominal pain EXAM: CHEST  1 VIEW COMPARISON:  08/19/2018 FINDINGS: Cardiomediastinal silhouette unchanged in size and contour. Persistent obscuration the right hemidiaphragm and the right heart border by dense opacity at the right lung base, layer to the apex. Pleuroparenchymal thickening at the right apex. Left lung relatively well aerated with coarsened interstitial markings. IMPRESSION: Right-sided pleural effusion, similar to the  comparison with obscuration the right heart border and the right hemidiaphragm. Associated atelectasis/consolidation. Left lung relatively well aerated. Electronically Signed   By: Corrie Mckusick D.O.   On: 09/01/2018 21:28   Ct Abdomen Pelvis W Contrast  Result Date: 09/01/2018 CLINICAL DATA:  Abdominal distension. Ongoing abdominal pain. Patient reports she has not voided since yesterday. No bowel movement in 1 week. EXAM: CT ABDOMEN AND PELVIS WITH CONTRAST TECHNIQUE: Multidetector CT imaging of the abdomen and pelvis was performed using the standard protocol following bolus administration of intravenous contrast. CONTRAST:  180mL ISOVUE-300 IOPAMIDOL (ISOVUE-300) INJECTION 61% COMPARISON:  Abdominal CT 08/19/2018 FINDINGS: Lower chest: Right pleural effusion is likely decreased in size, persistent moderate volume of pleural fluid. Associated atelectasis of the right lower and right middle lobes. Lobulated 5.3 cm left breast mass. Adjacent 2.6 cm rounded density in the lower left chest wall soft tissues. Hepatobiliary: No focal hepatic lesion. Postcholecystectomy with unchanged biliary prominence, common bile duct measures 12 mm at the porta hepatis. No visualized choledocholithiasis. Pancreas:  No ductal dilatation or inflammation. Spleen: Normal in size without focal abnormality. Adrenals/Urinary Tract: No adrenal nodule. No hydronephrosis. Mild perinephric edema, slightly progressed from prior exam. Simple cysts in the right kidney. Homogeneous renal enhancement with symmetric excretion on delayed phase imaging. Bladder distention to the level of the umbilicus, bladder volume 19.3 x 14.7 x 13.4 cm (volume = 1990 cm^3). No bladder wall thickening. Stomach/Bowel: Lack of enteric contrast limits bowel assessment. The stomach is nondistended. No small bowel obstruction. Wall thickening of the ascending colon has slightly improved from prior exam. Pericolonic/right lower quadrant haziness persists. There is  sigmoid colonic redundancy. Distal colonic diverticulosis without diverticulitis. Small to moderate colonic stool burden. No free air. Vascular/Lymphatic: Aortic atherosclerosis. No aneurysm. Multiple small retroperitoneal nodes largest 7 mm. No definite pelvic adenopathy. Reproductive: Enlarged uterus with uterine calcifications. Large soft tissue mass in the right pelvis appears contiguous with enlarged uterus, unchanged in size from prior exam but no deviated more to the right. Background enlarged uterus with multiple calcifications. Complex right adnexal mass measuring 4.8 x 3.6 cm is again seen. Other: Stranding/edema in the right pelvis small amount of free fluid in the right pericolic gutter, similar to prior exam. Musculoskeletal: Degenerative change in the spine. No suspicious bone lesion. IMPRESSION: 1. Distended urinary bladder with bladder volume of nearly 2000 cc. 2. Enlarged uterus with large exophytic uterine mass as well as right adnexal mass, unchanged from CT 2 weeks ago. Recommend correlation with prior workup. 3. Improved wall thickening of the ascending colon. Mild adjacent pericolonic and right lower quadrant edema persists. Colonic diverticulosis without diverticulitis. 4. Right pleural effusion has likely decreased in size, with moderate persisting effusion. 5. Left breast mass. 6.  Aortic Atherosclerosis (ICD10-I70.0). Electronically Signed   By: Keith Rake M.D.   On: 09/01/2018 21:17   Ct Abdomen Pelvis W Contrast  Result Date: 08/19/2018 CLINICAL DATA:  Left lower abdominal pain for 1 week. History of cholecystectomy. EXAM: CT ABDOMEN AND PELVIS WITH CONTRAST TECHNIQUE: Multidetector CT imaging of the abdomen and pelvis was performed using the standard protocol following bolus administration of intravenous contrast. CONTRAST:  164mL OMNIPAQUE IOHEXOL 300 MG/ML  SOLN COMPARISON:  10/28/2007 CT abdomen/pelvis. FINDINGS: Motion degraded scan, limiting assessment. Lower chest: Large  right pleural effusion. Hepatobiliary: Normal liver size. No liver mass. Cholecystectomy. Bile ducts are within normal post cholecystectomy limits. Pancreas: Normal, with no mass or duct dilation. Spleen: Normal size. No mass. Adrenals/Urinary Tract: Normal adrenals. No hydronephrosis. Simple right renal cysts, largest 2.5 cm. Additional subcentimeter hypodense renal cortical lesions in the right kidney are too small to characterize and require no follow-up. Normal bladder. Stomach/Bowel: Normal non-distended stomach. Normal caliber small bowel with no small bowel wall thickening. Normal appendix. Moderate diffuse colonic diverticulosis. There is mild wall thickening throughout the right colon with haziness of the right pericolonic fat. Vascular/Lymphatic: Atherosclerotic nonaneurysmal abdominal aorta. Patent portal, splenic, hepatic and renal veins. No pathologically enlarged lymph nodes in the abdomen or pelvis. Reproductive: There is a complex cystic 5.4 x 4.5 cm right adnexal mass (series 2/image 66). There is a markedly enlarged uterus with dominant 14.6 cm exophytic anterior uterine mass with coarse posterior internal calcifications, which is essentially new since 2009. Other: Small volume ascites. No focal fluid collection. No pneumoperitoneum. Musculoskeletal: No aggressive appearing focal osseous lesions. Marked thoracolumbar spondylosis. IMPRESSION: 1. Markedly enlarged uterus with dominant 14.6 cm exophytic anterior uterine mass, which is essentially new since 2009 CT. Differential includes bulky uterine fibroid versus leiomyosarcoma. Gyn consultation  suggested. 2. Complex cystic 5.4 cm right adnexal mass, right ovarian neoplasm not excluded. 3. MRI pelvis without and with IV contrast may be considered for further characterization of these gynecologic masses. 4. Mild wall thickening throughout the right colon with haziness of the pericolonic fat, which could be due to noninflammatory edema versus  nonspecific mild colitis. Although there is moderate diffuse colonic diverticulosis, these findings are not favored represent acute diverticulitis. 5. Small volume ascites. 6. Large right pleural effusion. 7.  Aortic Atherosclerosis (ICD10-I70.0). Electronically Signed   By: Ilona Sorrel M.D.   On: 08/19/2018 12:44   Dg Chest Port 1 View  Result Date: 08/19/2018 CLINICAL DATA:  Cough. EXAM: PORTABLE CHEST 1 VIEW COMPARISON:  None. FINDINGS: The heart size and mediastinal contours are within normal limits. Large right pleural effusion is noted with probable underlying atelectasis or infiltrate. Left lung is clear. No pneumothorax is noted. The visualized skeletal structures are unremarkable. IMPRESSION: Large right pleural effusion is noted with probable underlying atelectasis or infiltrate. Electronically Signed   By: Marijo Conception, M.D.   On: 08/19/2018 10:39       Subjective: Reports some continued abdominal pain. No shortness of breath or cough.  Discharge Exam: Vitals:   09/03/18 1436 09/03/18 2232 09/04/18 0359 09/04/18 0643  BP: 110/64 124/64  (!) 117/57  Pulse: 100 100  (!) 103  Resp: 18 20  (!) 28  Temp:  98.6 F (37 C)  (!) 100.8 F (38.2 C)  TempSrc:  Oral  Oral  SpO2: 93% 93%  90%  Weight:   96 kg   Height:        General: Pt is alert, awake, not in acute distress Cardiovascular: RRR, S1/S2 +, no rubs, no gallops Respiratory: CTA bilaterally, no wheezing, no rhonchi Abdominal: Soft, tender on right side, ND, bowel sounds + Extremities: no edema, no cyanosis    The results of significant diagnostics from this hospitalization (including imaging, microbiology, ancillary and laboratory) are listed below for reference.     Microbiology: No results found for this or any previous visit (from the past 240 hour(s)).   Labs: BNP (last 3 results) No results for input(s): BNP in the last 8760 hours. Basic Metabolic Panel: Recent Labs  Lab 09/01/18 1742 09/02/18 0521   NA 136 136  K 3.7 3.7  CL 105 110  CO2 21* 20*  GLUCOSE 115* 82  BUN 16 13  CREATININE 0.85 0.70  CALCIUM 8.7* 7.9*  MG  --  2.0   Liver Function Tests: Recent Labs  Lab 09/01/18 1742 09/02/18 0521  AST 22 16  ALT 13 10  ALKPHOS 98 85  BILITOT 1.0 0.8  PROT 7.1 5.7*  ALBUMIN 2.8* 2.2*   No results for input(s): LIPASE, AMYLASE in the last 168 hours. No results for input(s): AMMONIA in the last 168 hours. CBC: Recent Labs  Lab 09/01/18 1742 09/02/18 0521 09/02/18 1204 09/03/18 0656  WBC 20.3* 15.9* 14.6* 13.4*  NEUTROABS 16.4* 12.4*  --   --   HGB 10.6* 8.7* 8.6* 8.4*  HCT 34.0* 27.3* 27.1* 27.1*  MCV 95.5 93.8 95.1 95.4  PLT 492* 411* 379 405*   Cardiac Enzymes: No results for input(s): CKTOTAL, CKMB, CKMBINDEX, TROPONINI in the last 168 hours. BNP: Invalid input(s): POCBNP CBG: No results for input(s): GLUCAP in the last 168 hours. D-Dimer No results for input(s): DDIMER in the last 72 hours. Hgb A1c No results for input(s): HGBA1C in the last 72 hours. Lipid Profile  No results for input(s): CHOL, HDL, LDLCALC, TRIG, CHOLHDL, LDLDIRECT in the last 72 hours. Thyroid function studies No results for input(s): TSH, T4TOTAL, T3FREE, THYROIDAB in the last 72 hours.  Invalid input(s): FREET3 Anemia work up Recent Labs    09/03/18 1953  VITAMINB12 308  FOLATE 9.4  FERRITIN 543*  TIBC 160*  IRON 12*  RETICCTPCT 1.4   Urinalysis    Component Value Date/Time   COLORURINE YELLOW 09/01/2018 2136   APPEARANCEUR CLEAR 09/01/2018 2136   LABSPEC 1.025 09/01/2018 2136   PHURINE 7.0 09/01/2018 2136   GLUCOSEU NEGATIVE 09/01/2018 2136   HGBUR NEGATIVE 09/01/2018 2136   BILIRUBINUR NEGATIVE 09/01/2018 2136   KETONESUR NEGATIVE 09/01/2018 2136   PROTEINUR NEGATIVE 09/01/2018 2136   UROBILINOGEN 0.2 10/28/2007 1919   NITRITE NEGATIVE 09/01/2018 2136   LEUKOCYTESUR NEGATIVE 09/01/2018 2136   Sepsis Labs Invalid input(s): PROCALCITONIN,  WBC,   LACTICIDVEN Microbiology No results found for this or any previous visit (from the past 240 hour(s)).   Time coordinating discharge: 40mins  SIGNED:   Kathie Dike, MD  [facility] 09/04/2018, 9:09 AM   If 7PM-7AM, please contact night-coverage [email or web address]

## 2018-09-04 NOTE — Progress Notes (Signed)
Patient states understanding of discharge instructions.  

## 2018-09-10 DIAGNOSIS — Z90722 Acquired absence of ovaries, bilateral: Secondary | ICD-10-CM | POA: Diagnosis not present

## 2018-09-10 DIAGNOSIS — F17210 Nicotine dependence, cigarettes, uncomplicated: Secondary | ICD-10-CM | POA: Diagnosis not present

## 2018-09-10 DIAGNOSIS — C5702 Malignant neoplasm of left fallopian tube: Secondary | ICD-10-CM | POA: Insufficient documentation

## 2018-09-10 DIAGNOSIS — R2689 Other abnormalities of gait and mobility: Secondary | ICD-10-CM | POA: Diagnosis not present

## 2018-09-10 DIAGNOSIS — Z171 Estrogen receptor negative status [ER-]: Secondary | ICD-10-CM | POA: Diagnosis not present

## 2018-09-10 DIAGNOSIS — Z90710 Acquired absence of both cervix and uterus: Secondary | ICD-10-CM | POA: Diagnosis not present

## 2018-09-10 DIAGNOSIS — C50812 Malignant neoplasm of overlapping sites of left female breast: Secondary | ICD-10-CM | POA: Diagnosis not present

## 2018-09-10 DIAGNOSIS — I10 Essential (primary) hypertension: Secondary | ICD-10-CM | POA: Diagnosis not present

## 2018-09-10 DIAGNOSIS — Z48816 Encounter for surgical aftercare following surgery on the genitourinary system: Secondary | ICD-10-CM | POA: Diagnosis not present

## 2018-09-12 DIAGNOSIS — Z48816 Encounter for surgical aftercare following surgery on the genitourinary system: Secondary | ICD-10-CM | POA: Diagnosis not present

## 2018-09-12 DIAGNOSIS — C50812 Malignant neoplasm of overlapping sites of left female breast: Secondary | ICD-10-CM | POA: Diagnosis not present

## 2018-09-12 DIAGNOSIS — Z90710 Acquired absence of both cervix and uterus: Secondary | ICD-10-CM | POA: Diagnosis not present

## 2018-09-12 DIAGNOSIS — Z90722 Acquired absence of ovaries, bilateral: Secondary | ICD-10-CM | POA: Diagnosis not present

## 2018-09-12 DIAGNOSIS — R2689 Other abnormalities of gait and mobility: Secondary | ICD-10-CM | POA: Diagnosis not present

## 2018-09-12 DIAGNOSIS — Z171 Estrogen receptor negative status [ER-]: Secondary | ICD-10-CM | POA: Diagnosis not present

## 2018-09-13 ENCOUNTER — Encounter (HOSPITAL_COMMUNITY): Payer: Self-pay

## 2018-09-13 ENCOUNTER — Other Ambulatory Visit: Payer: Self-pay

## 2018-09-13 ENCOUNTER — Emergency Department (HOSPITAL_COMMUNITY)
Admission: EM | Admit: 2018-09-13 | Discharge: 2018-09-13 | Disposition: A | Discharge status: Home / Self Care | Payer: Medicare Other | Attending: Emergency Medicine | Admitting: Emergency Medicine

## 2018-09-13 DIAGNOSIS — Z79899 Other long term (current) drug therapy: Secondary | ICD-10-CM | POA: Insufficient documentation

## 2018-09-13 DIAGNOSIS — R630 Anorexia: Secondary | ICD-10-CM | POA: Insufficient documentation

## 2018-09-13 DIAGNOSIS — F17210 Nicotine dependence, cigarettes, uncomplicated: Secondary | ICD-10-CM | POA: Diagnosis not present

## 2018-09-13 DIAGNOSIS — R52 Pain, unspecified: Secondary | ICD-10-CM | POA: Diagnosis not present

## 2018-09-13 DIAGNOSIS — Z853 Personal history of malignant neoplasm of breast: Secondary | ICD-10-CM | POA: Insufficient documentation

## 2018-09-13 DIAGNOSIS — I959 Hypotension, unspecified: Secondary | ICD-10-CM | POA: Diagnosis not present

## 2018-09-13 DIAGNOSIS — I10 Essential (primary) hypertension: Secondary | ICD-10-CM | POA: Diagnosis not present

## 2018-09-13 DIAGNOSIS — R0902 Hypoxemia: Secondary | ICD-10-CM | POA: Diagnosis not present

## 2018-09-13 DIAGNOSIS — R1084 Generalized abdominal pain: Secondary | ICD-10-CM | POA: Diagnosis not present

## 2018-09-13 LAB — BASIC METABOLIC PANEL
Anion gap: 9 (ref 5–15)
BUN: 12 mg/dL (ref 8–23)
CO2: 22 mmol/L (ref 22–32)
Calcium: 8.7 mg/dL — ABNORMAL LOW (ref 8.9–10.3)
Chloride: 107 mmol/L (ref 98–111)
Creatinine, Ser: 0.72 mg/dL (ref 0.44–1.00)
GFR calc Af Amer: >60 mL/min (ref >60)
GFR calc non Af Amer: >60 mL/min (ref >60)
GLUCOSE: 92 mg/dL (ref 70–99)
Potassium: 3.5 mmol/L (ref 3.5–5.1)
Sodium: 138 mmol/L (ref 135–145)

## 2018-09-13 LAB — CBC WITH DIFFERENTIAL/PLATELET
Abs Immature Granulocytes: 0.65 10*3/uL — ABNORMAL HIGH (ref 0.00–0.07)
Basophils Absolute: 0.1 10*3/uL (ref 0.0–0.1)
Basophils Relative: 1 %
Eosinophils Absolute: 0.5 10*3/uL (ref 0.0–0.5)
Eosinophils Relative: 5 %
HCT: 29.4 % — ABNORMAL LOW (ref 36.0–46.0)
HEMOGLOBIN: 9.2 g/dL — AB (ref 12.0–15.0)
Immature Granulocytes: 7 %
Lymphocytes Relative: 20 %
Lymphs Abs: 1.9 10*3/uL (ref 0.7–4.0)
MCH: 29.7 pg (ref 26.0–34.0)
MCHC: 31.3 g/dL (ref 30.0–36.0)
MCV: 94.8 fL (ref 80.0–100.0)
MONOS PCT: 8 %
Monocytes Absolute: 0.7 10*3/uL (ref 0.1–1.0)
Neutro Abs: 5.5 10*3/uL (ref 1.7–7.7)
Neutrophils Relative %: 59 %
Platelets: 609 10*3/uL — ABNORMAL HIGH (ref 150–400)
RBC: 3.1 MIL/uL — ABNORMAL LOW (ref 3.87–5.11)
RDW: 13.8 % (ref 11.5–15.5)
WBC: 9.4 10*3/uL (ref 4.0–10.5)
nRBC: 0 % (ref 0.0–0.2)

## 2018-09-13 LAB — URINALYSIS, ROUTINE W REFLEX MICROSCOPIC
Bilirubin Urine: NEGATIVE
Glucose, UA: NEGATIVE mg/dL
Hgb urine dipstick: NEGATIVE
Ketones, ur: NEGATIVE mg/dL
Leukocytes, UA: NEGATIVE
Nitrite: NEGATIVE
Protein, ur: NEGATIVE mg/dL
Specific Gravity, Urine: 1.015 (ref 1.005–1.030)
pH: 7 (ref 5.0–8.0)

## 2018-09-13 MED ORDER — SODIUM CHLORIDE 0.9 % IV BOLUS
500.0000 mL | Freq: Once | INTRAVENOUS | Status: AC
  Administered 2018-09-13: 500 mL via INTRAVENOUS

## 2018-09-13 NOTE — ED Triage Notes (Signed)
Pt brought in by EMS. Pt reports abdominal hysterectomy and is uncomfortable. SX was one week ago and due to fibroids. Pt complains that she can not eat

## 2018-09-13 NOTE — ED Provider Notes (Signed)
[facility] EMERGENCY DEPARTMENT Provider Note   CSN: [id number] Arrival date & time: 09/13/18  1550     History   Chief Complaint Chief Complaint  Patient presents with  . Anorexia    HPI Sierra Roberts is a 68 y.o. female.  HPI Patient had transabdominal hysterectomy performed on 09/04/2018.  States that since that time she is had decreased appetite with poor p.o. intake.  States there is no mechanical difficulty swallowing.  No nausea or vomiting.  Endorses generalized fatigue.  No abdominal pain, fever or chills. Past Medical History:  Diagnosis Date  . C. difficile diarrhea   . Cancer [facility])    left breast cancer  . High cholesterol   . Hypertension     Patient Active Problem List   Diagnosis Date Noted  . Pelvic mass 09/02/2018  . Leukocytosis 09/02/2018  . Abdominal pain 09/02/2018  . Tobacco abuse 09/02/2018  . Urinary retention 09/01/2018  . Encounter for screening colonoscopy 12/05/2012  . Rectal bleeding 10/16/2012    Past Surgical History:  Procedure Laterality Date  . ABDOMINAL HYSTERECTOMY    . CHOLECYSTECTOMY    . COLONOSCOPY N/A 12/30/2012   Procedure: COLONOSCOPY;  Surgeon: Daneil Dolin, MD;  Location: [facility] ENDO SUITE;  Service: Endoscopy;  Laterality: N/A;  8:30-rescheduled to 9:30 Darius Bump notified pt  . DILATION AND CURETTAGE OF UTERUS     to remove IUD     OB History    Gravida  1   Para  1   Term  1   Preterm      AB      Living        SAB      TAB      Ectopic      Multiple      Live Births               Home Medications    Prior to Admission medications   Medication Sig Start Date End Date Taking? Authorizing Provider  acetaminophen (TYLENOL) 500 MG tablet Take 1,000 mg by mouth every 6 (six) hours as needed for mild pain or moderate pain.    Yes [provider]  enoxaparin (LOVENOX) 40 MG/0.4ML injection Inject 0.4 mLs into the skin. 09/09/18 10/04/18 Yes [provider]  fenofibrate (TRICOR) 145  MG tablet Take 145 mg by mouth daily. 04/18/18  Yes [provider]  metoCLOPramide (REGLAN) 10 MG tablet Take 10 mg by mouth every 6 (six) hours.   Yes [provider]  Nicotine Polacrilex (COMMIT MT) Use as directed 1 lozenge in the mouth or throat daily as needed (for cessation).   Yes [provider]  simvastatin (ZOCOR) 20 MG tablet Take 20 mg by mouth daily.   Yes [provider]  dicyclomine (BENTYL) 10 MG capsule Take 1 capsule (10 mg total) by mouth 3 (three) times daily before meals. 09/04/18   Kathie Dike, MD    Family History Family History  Problem Relation Age of Onset  . Colon cancer Neg Hx     Social History Social History   Tobacco Use  . Smoking status: Current Every Day Smoker    Packs/day: 0.50    Years: 44.00    Pack years: 22.00    Types: Cigarettes  . Smokeless tobacco: Never Used  . Tobacco comment: now down to less than 1/2 a pack  Substance Use Topics  . Alcohol use: Not Currently    Alcohol/week: 2.0 standard drinks  Types: 2 Glasses of wine per week  . Drug use: No     Allergies   Clams [shellfish allergy] and Flagyl [metronidazole]   Review of Systems Review of Systems  Constitutional: Positive for appetite change and fatigue. Negative for chills and fever.  Respiratory: Negative for shortness of breath.   Cardiovascular: Negative for chest pain.  Gastrointestinal: Negative for abdominal pain, constipation, diarrhea, nausea and vomiting.  Genitourinary: Negative for dysuria, flank pain and frequency.  Musculoskeletal: Negative for back pain, myalgias and neck pain.  Skin: Negative for rash and wound.  Neurological: Negative for dizziness, weakness, light-headedness, numbness and headaches.  All other systems reviewed and are negative.    Physical Exam Updated Vital Signs BP 127/70   Pulse 90   Temp 98.3 F (36.8 C) (Oral)   Resp 14   Wt 97.1 kg   SpO2 95%   BMI 36.73 kg/m   Physical  Exam Vitals signs and nursing note reviewed.  Constitutional:      General: She is not in acute distress.    Appearance: Normal appearance. She is well-developed. She is not ill-appearing.  HENT:     Head: Normocephalic and atraumatic.     Mouth/Throat:     Mouth: Mucous membranes are dry.     Pharynx: No oropharyngeal exudate or posterior oropharyngeal erythema.  Eyes:     Extraocular Movements: Extraocular movements intact.     Pupils: Pupils are equal, round, and reactive to light.  Neck:     Musculoskeletal: Normal range of motion and neck supple.  Cardiovascular:     Rate and Rhythm: Normal rate and regular rhythm.  Pulmonary:     Effort: Pulmonary effort is normal.     Breath sounds: Normal breath sounds.  Abdominal:     General: Bowel sounds are normal.     Palpations: Abdomen is soft.     Tenderness: There is no abdominal tenderness. There is no guarding or rebound.     Comments: Surgical wounds appear to be well-healing.  No evidence of infection.  Abdomen is soft and nontender.  Musculoskeletal: Normal range of motion.        General: No swelling, tenderness, deformity or signs of injury.     Right lower leg: No edema.     Left lower leg: No edema.  Skin:    General: Skin is warm and dry.     Capillary Refill: Capillary refill takes less than 2 seconds.     Findings: No erythema or rash.  Neurological:     General: No focal deficit present.     Mental Status: She is alert and oriented to person, place, and time.  Psychiatric:        Mood and Affect: Mood normal.        Behavior: Behavior normal.      ED Treatments / Results  Labs (all labs ordered are listed, but only abnormal results are displayed) Labs Reviewed  BASIC METABOLIC PANEL - Abnormal; Notable for the following components:      Result Value   Calcium 8.7 (*)    All other components within normal limits  CBC WITH DIFFERENTIAL/PLATELET - Abnormal; Notable for the following components:   RBC 3.10  (*)    Hemoglobin 9.2 (*)    HCT 29.4 (*)    Platelets 609 (*)    Abs Immature Granulocytes 0.65 (*)    All other components within normal limits  URINALYSIS, ROUTINE W REFLEX MICROSCOPIC    EKG  None  Radiology No results found.  Procedures Procedures (including critical care time)  Medications Ordered in ED Medications  sodium chloride 0.9 % bolus 500 mL (0 mLs Intravenous Stopped 09/13/18 1929)  sodium chloride 0.9 % bolus 500 mL (0 mLs Intravenous Stopped 09/13/18 2125)     Initial Impression / Assessment and Plan / ED Course  I have reviewed the triage vital signs and the nursing notes.  Pertinent labs & imaging results that were available during my care of the patient were reviewed by me and considered in my medical decision making (see chart for details).     Patient states she is feeling better after IV fluids.  Vital signs and laboratory work-up is normal.  Encourage patient to supplement her diet with boost shakes and drink plenty of water.  Advised to follow-up with her primary physician.  Return precautions given.  Final Clinical Impressions(s) / ED Diagnoses   Final diagnoses:  Decreased appetite    ED Discharge Orders    None       Julianne Rice, MD 09/13/18 2305

## 2018-09-13 NOTE — Discharge Summary (Signed)
Physician Discharge Summary  Sierra Roberts ZDG:[id number] DOB: 09/29/1950 DOA: 09/01/2018  PCP: Cory Munch, PA-C  Admit date: 09/01/2018 Discharge date: 09/04/2018  Admitted From: home Disposition:  home  Recommendations for Outpatient Follow-up:  1. Patient will be discharged today 2. She will travel straight to [place] for scheduled biopsy/removal of pelvic mass 3. Would recommend that urinary catheter be reinserted at [facility] for urinary retention 4. Recommend further work up for anemia  Discharge Condition:stable CODE STATUS: full code Diet recommendation: regular diet  Brief/Interim Summary: 68 year old female who was recently diagnosed with invasive ductal breast carcinoma and has a uterine mass which she is undergoing work-up for a [facility], presents to the hospital with abdominal pain.  She was found to have substantial urinary retention and abdominal discomfort.  Foley catheter was placed in the emergency room and she expelled 2 L of urine.  CT scan of the abdomen pelvis showed enlarged bladder, but did not show any other acute findings.  Previously documented thickening of ascending colon has shown improvement on current CT.  She did have a chest x-ray that showed pleural effusion.  No evidence of pneumonia.  She did not complain of shortness of breath or cough.  Urinalysis did not show any signs of infection.  She did have significant constipation and reported that she had not had a bowel movement in a week.  CT did not show any evidence of bowel obstruction.  Patient did receive an enema and had a medium bowel movement.  She was monitored in the hospital and treated for abdominal pain.  I suspect that her pain may be related to her pelvic mass.  At this point, pain appears to be reasonably controlled.  She did have a significant leukocytosis which improved with IV fluids.  She has a low-grade fever of 100.2, but does not appear septic or toxic.  This may be related to  atelectasis from pleural effusion.  She was also noted to have significant anemia when compared to prior labs.  She has not noticed any gross bleeding.  She does report some blood with straining for bowel movements which she attributes to hemorrhoids.  Anemia panel indicates mix of iron deficiency as well as chronic disease.  This can be further worked up as an outpatient.  Her hemoglobin has been stable in the hospital and she has not required transfusion.  After monitoring in the hospital, patient reports that her abdominal pain is stable.  She requested that her Foley catheter be removed.  It was explained to her the importance of maintaining bladder decompression since she had significant urinary retention.  Patient was very adamant about having catheter removed and says she will have this readdressed when she goes to [facility].  Would recommend that catheter be replaced at [facility] since she will likely have difficulty emptying her bladder for the next several weeks.  She is expelling small amounts (100 to 200 cc of urine) at the time, but is likely incompletely voiding and will develop significant urinary retention.  Patient does have a procedure scheduled today at [facility] for biopsy/removal of pelvic mass.  She wishes to discharge today and go straight there.  She does not want to stay in the hospital for any further work-up of anemia/low-grade fever.  She reports that she will address this over [facility].  At this time, patient does not appear to be septic.  She is felt stable for discharge.  Discharge Diagnoses:  Principal Problem:  Urinary retention Active Problems:   Pelvic mass   Leukocytosis   Abdominal pain   Tobacco abuse    Discharge Instructions  Discharge Instructions    Diet - low sodium heart healthy   Complete by:  As directed    Increase activity slowly   Complete by:  As directed      Allergies as of 09/04/2018      Reactions   Clams [shellfish Allergy] Diarrhea,  Nausea And Vomiting   Flagyl [metronidazole] Other (See Comments)   Numbness       Medication List    STOP taking these medications   lisinopril-hydrochlorothiazide 20-12.5 MG tablet Commonly known as:  PRINZIDE,ZESTORETIC     TAKE these medications   acetaminophen 500 MG tablet Commonly known as:  TYLENOL Take 1,000 mg by mouth every 6 (six) hours as needed for mild pain or moderate pain.   COMMIT MT Use as directed 1 lozenge in the mouth or throat daily as needed (for cessation).   dicyclomine 10 MG capsule Commonly known as:  BENTYL Take 1 capsule (10 mg total) by mouth 3 (three) times daily before meals.   metoCLOPramide 10 MG tablet Commonly known as:  REGLAN Take 10 mg by mouth every 6 (six) hours.   simvastatin 20 MG tablet Commonly known as:  ZOCOR Take 20 mg by mouth daily.      Follow-up Information    [facility] [place] On 09/19/2018.   Why:  Appointment Feb. 14, 2020 at 9:45am. please arrive at 9:15 to register Contact information: [address], [address] [place] [address] [phone]         Allergies  Allergen Reactions  . Clams [Shellfish Allergy] Diarrhea and Nausea And Vomiting  . Flagyl [Metronidazole] Other (See Comments)    Numbness     Consultations:     Procedures/Studies: Dg Chest 1 View  Result Date: 09/01/2018 CLINICAL DATA:  68 year old female with a history of abdominal pain EXAM: CHEST  1 VIEW COMPARISON:  08/19/2018 FINDINGS: Cardiomediastinal silhouette unchanged in size and contour. Persistent obscuration the right hemidiaphragm and the right heart border by dense opacity at the right lung base, layer to the apex. Pleuroparenchymal thickening at the right apex. Left lung relatively well aerated with coarsened interstitial markings. IMPRESSION: Right-sided pleural effusion, similar to the comparison with obscuration the right heart border and the right hemidiaphragm. Associated atelectasis/consolidation.  Left lung relatively well aerated. Electronically Signed   By: Corrie Mckusick D.O.   On: 09/01/2018 21:28   Ct Abdomen Pelvis W Contrast  Result Date: 09/01/2018 CLINICAL DATA:  Abdominal distension. Ongoing abdominal pain. Patient reports she has not voided since yesterday. No bowel movement in 1 week. EXAM: CT ABDOMEN AND PELVIS WITH CONTRAST TECHNIQUE: Multidetector CT imaging of the abdomen and pelvis was performed using the standard protocol following bolus administration of intravenous contrast. CONTRAST:  181mL ISOVUE-300 IOPAMIDOL (ISOVUE-300) INJECTION 61% COMPARISON:  Abdominal CT 08/19/2018 FINDINGS: Lower chest: Right pleural effusion is likely decreased in size, persistent moderate volume of pleural fluid. Associated atelectasis of the right lower and right middle lobes. Lobulated 5.3 cm left breast mass. Adjacent 2.6 cm rounded density in the lower left chest wall soft tissues. Hepatobiliary: No focal hepatic lesion. Postcholecystectomy with unchanged biliary prominence, common bile duct measures 12 mm at the porta hepatis. No visualized choledocholithiasis. Pancreas: No ductal dilatation or inflammation. Spleen: Normal in size without focal abnormality. Adrenals/Urinary Tract: No adrenal nodule. No hydronephrosis. Mild perinephric edema, slightly progressed  from prior exam. Simple cysts in the right kidney. Homogeneous renal enhancement with symmetric excretion on delayed phase imaging. Bladder distention to the level of the umbilicus, bladder volume 19.3 x 14.7 x 13.4 cm (volume = 1990 cm^3). No bladder wall thickening. Stomach/Bowel: Lack of enteric contrast limits bowel assessment. The stomach is nondistended. No small bowel obstruction. Wall thickening of the ascending colon has slightly improved from prior exam. Pericolonic/right lower quadrant haziness persists. There is sigmoid colonic redundancy. Distal colonic diverticulosis without diverticulitis. Small to moderate colonic stool burden.  No free air. Vascular/Lymphatic: Aortic atherosclerosis. No aneurysm. Multiple small retroperitoneal nodes largest 7 mm. No definite pelvic adenopathy. Reproductive: Enlarged uterus with uterine calcifications. Large soft tissue mass in the right pelvis appears contiguous with enlarged uterus, unchanged in size from prior exam but no deviated more to the right. Background enlarged uterus with multiple calcifications. Complex right adnexal mass measuring 4.8 x 3.6 cm is again seen. Other: Stranding/edema in the right pelvis small amount of free fluid in the right pericolic gutter, similar to prior exam. Musculoskeletal: Degenerative change in the spine. No suspicious bone lesion. IMPRESSION: 1. Distended urinary bladder with bladder volume of nearly 2000 cc. 2. Enlarged uterus with large exophytic uterine mass as well as right adnexal mass, unchanged from CT 2 weeks ago. Recommend correlation with prior workup. 3. Improved wall thickening of the ascending colon. Mild adjacent pericolonic and right lower quadrant edema persists. Colonic diverticulosis without diverticulitis. 4. Right pleural effusion has likely decreased in size, with moderate persisting effusion. 5. Left breast mass. 6.  Aortic Atherosclerosis (ICD10-I70.0). Electronically Signed   By: Keith Rake M.D.   On: 09/01/2018 21:17   Ct Abdomen Pelvis W Contrast  Result Date: 08/19/2018 CLINICAL DATA:  Left lower abdominal pain for 1 week. History of cholecystectomy. EXAM: CT ABDOMEN AND PELVIS WITH CONTRAST TECHNIQUE: Multidetector CT imaging of the abdomen and pelvis was performed using the standard protocol following bolus administration of intravenous contrast. CONTRAST:  165mL OMNIPAQUE IOHEXOL 300 MG/ML  SOLN COMPARISON:  10/28/2007 CT abdomen/pelvis. FINDINGS: Motion degraded scan, limiting assessment. Lower chest: Large right pleural effusion. Hepatobiliary: Normal liver size. No liver mass. Cholecystectomy. Bile ducts are within normal  post cholecystectomy limits. Pancreas: Normal, with no mass or duct dilation. Spleen: Normal size. No mass. Adrenals/Urinary Tract: Normal adrenals. No hydronephrosis. Simple right renal cysts, largest 2.5 cm. Additional subcentimeter hypodense renal cortical lesions in the right kidney are too small to characterize and require no follow-up. Normal bladder. Stomach/Bowel: Normal non-distended stomach. Normal caliber small bowel with no small bowel wall thickening. Normal appendix. Moderate diffuse colonic diverticulosis. There is mild wall thickening throughout the right colon with haziness of the right pericolonic fat. Vascular/Lymphatic: Atherosclerotic nonaneurysmal abdominal aorta. Patent portal, splenic, hepatic and renal veins. No pathologically enlarged lymph nodes in the abdomen or pelvis. Reproductive: There is a complex cystic 5.4 x 4.5 cm right adnexal mass (series 2/image 66). There is a markedly enlarged uterus with dominant 14.6 cm exophytic anterior uterine mass with coarse posterior internal calcifications, which is essentially new since 2009. Other: Small volume ascites. No focal fluid collection. No pneumoperitoneum. Musculoskeletal: No aggressive appearing focal osseous lesions. Marked thoracolumbar spondylosis. IMPRESSION: 1. Markedly enlarged uterus with dominant 14.6 cm exophytic anterior uterine mass, which is essentially new since 2009 CT. Differential includes bulky uterine fibroid versus leiomyosarcoma. Gyn consultation suggested. 2. Complex cystic 5.4 cm right adnexal mass, right ovarian neoplasm not excluded. 3. MRI pelvis without and with IV contrast may be  considered for further characterization of these gynecologic masses. 4. Mild wall thickening throughout the right colon with haziness of the pericolonic fat, which could be due to noninflammatory edema versus nonspecific mild colitis. Although there is moderate diffuse colonic diverticulosis, these findings are not favored represent  acute diverticulitis. 5. Small volume ascites. 6. Large right pleural effusion. 7.  Aortic Atherosclerosis (ICD10-I70.0). Electronically Signed   By: Ilona Sorrel M.D.   On: 08/19/2018 12:44   Dg Chest Port 1 View  Result Date: 08/19/2018 CLINICAL DATA:  Cough. EXAM: PORTABLE CHEST 1 VIEW COMPARISON:  None. FINDINGS: The heart size and mediastinal contours are within normal limits. Large right pleural effusion is noted with probable underlying atelectasis or infiltrate. Left lung is clear. No pneumothorax is noted. The visualized skeletal structures are unremarkable. IMPRESSION: Large right pleural effusion is noted with probable underlying atelectasis or infiltrate. Electronically Signed   By: Marijo Conception, M.D.   On: 08/19/2018 10:39      Subjective: Reports some continued abdominal pain. No shortness of breath or cough.  Discharge Exam: Vitals:   09/03/18 1436 09/03/18 2232 09/04/18 0359 09/04/18 0643  BP: 110/64 124/64  (!) 117/57  Pulse: 100 100  (!) 103  Resp: 18 20  (!) 28  Temp:  98.6 F (37 C)  (!) 100.8 F (38.2 C)  TempSrc:  Oral  Oral  SpO2: 93% 93%  90%  Weight:   96 kg   Height:        General: Pt is alert, awake, not in acute distress Cardiovascular: RRR, S1/S2 +, no rubs, no gallops Respiratory: CTA bilaterally, no wheezing, no rhonchi Abdominal: Soft, tender on right side, ND, bowel sounds + Extremities: no edema, no cyanosis    The results of significant diagnostics from this hospitalization (including imaging, microbiology, ancillary and laboratory) are listed below for reference.     Microbiology: No results found for this or any previous visit (from the past 240 hour(s)).   Labs: BNP (last 3 results) No results for input(s): BNP in the last 8760 hours. Basic Metabolic Panel: Recent Labs  Lab 09/13/18 1739  NA 138  K 3.5  CL 107  CO2 22  GLUCOSE 92  BUN 12  CREATININE 0.72  CALCIUM 8.7*   Liver Function Tests: No results for input(s):  AST, ALT, ALKPHOS, BILITOT, PROT, ALBUMIN in the last 168 hours. No results for input(s): LIPASE, AMYLASE in the last 168 hours. No results for input(s): AMMONIA in the last 168 hours. CBC: Recent Labs  Lab 09/13/18 1739  WBC 9.4  NEUTROABS 5.5  HGB 9.2*  HCT 29.4*  MCV 94.8  PLT 609*   Cardiac Enzymes: No results for input(s): CKTOTAL, CKMB, CKMBINDEX, TROPONINI in the last 168 hours. BNP: Invalid input(s): POCBNP CBG: No results for input(s): GLUCAP in the last 168 hours. D-Dimer No results for input(s): DDIMER in the last 72 hours. Hgb A1c No results for input(s): HGBA1C in the last 72 hours. Lipid Profile No results for input(s): CHOL, HDL, LDLCALC, TRIG, CHOLHDL, LDLDIRECT in the last 72 hours. Thyroid function studies No results for input(s): TSH, T4TOTAL, T3FREE, THYROIDAB in the last 72 hours.  Invalid input(s): FREET3 Anemia work up No results for input(s): VITAMINB12, FOLATE, FERRITIN, TIBC, IRON, RETICCTPCT in the last 72 hours. Urinalysis    Component Value Date/Time   COLORURINE YELLOW 09/01/2018 2136   APPEARANCEUR CLEAR 09/01/2018 2136   LABSPEC 1.025 09/01/2018 2136   PHURINE 7.0 09/01/2018 2136   GLUCOSEU NEGATIVE  09/01/2018 2136   HGBUR NEGATIVE 09/01/2018 2136   BILIRUBINUR NEGATIVE 09/01/2018 2136   KETONESUR NEGATIVE 09/01/2018 2136   PROTEINUR NEGATIVE 09/01/2018 2136   UROBILINOGEN 0.2 10/28/2007 1919   NITRITE NEGATIVE 09/01/2018 2136   LEUKOCYTESUR NEGATIVE 09/01/2018 2136   Sepsis Labs Invalid input(s): PROCALCITONIN,  WBC,  LACTICIDVEN Microbiology No results found for this or any previous visit (from the past 240 hour(s)).   Time coordinating discharge: 62mins  SIGNED:   Kathie Dike, MD  [facility] 09/13/2018, 8:36 PM   If 7PM-7AM, please contact night-coverage [email or web address]

## 2018-09-13 NOTE — ED Notes (Signed)
Patient upset not being admitted. Refused to sign paperwork. States no one will do anything for her. Instructed patient to follow up with primary care physician for further symptoms.

## 2018-09-13 NOTE — Discharge Instructions (Addendum)
Supplement diet with boost shakes.  Drink water.  Follow-up closely with your primary physician.

## 2018-09-17 DIAGNOSIS — Z171 Estrogen receptor negative status [ER-]: Secondary | ICD-10-CM | POA: Diagnosis not present

## 2018-09-17 DIAGNOSIS — C50812 Malignant neoplasm of overlapping sites of left female breast: Secondary | ICD-10-CM | POA: Diagnosis not present

## 2018-09-17 DIAGNOSIS — C569 Malignant neoplasm of unspecified ovary: Secondary | ICD-10-CM | POA: Diagnosis not present

## 2018-09-17 DIAGNOSIS — C50919 Malignant neoplasm of unspecified site of unspecified female breast: Secondary | ICD-10-CM | POA: Diagnosis not present

## 2018-09-17 DIAGNOSIS — C50912 Malignant neoplasm of unspecified site of left female breast: Secondary | ICD-10-CM | POA: Diagnosis not present

## 2018-09-17 DIAGNOSIS — Z09 Encounter for follow-up examination after completed treatment for conditions other than malignant neoplasm: Secondary | ICD-10-CM | POA: Diagnosis not present

## 2018-09-17 DIAGNOSIS — C50612 Malignant neoplasm of axillary tail of left female breast: Secondary | ICD-10-CM | POA: Diagnosis not present

## 2018-09-19 ENCOUNTER — Ambulatory Visit: Payer: Medicare Other | Admitting: Urology

## 2018-09-19 DIAGNOSIS — Z171 Estrogen receptor negative status [ER-]: Secondary | ICD-10-CM | POA: Diagnosis not present

## 2018-09-19 DIAGNOSIS — Z48816 Encounter for surgical aftercare following surgery on the genitourinary system: Secondary | ICD-10-CM | POA: Diagnosis not present

## 2018-09-19 DIAGNOSIS — Z90710 Acquired absence of both cervix and uterus: Secondary | ICD-10-CM | POA: Diagnosis not present

## 2018-09-19 DIAGNOSIS — R2689 Other abnormalities of gait and mobility: Secondary | ICD-10-CM | POA: Diagnosis not present

## 2018-09-19 DIAGNOSIS — C50812 Malignant neoplasm of overlapping sites of left female breast: Secondary | ICD-10-CM | POA: Diagnosis not present

## 2018-09-19 DIAGNOSIS — Z90722 Acquired absence of ovaries, bilateral: Secondary | ICD-10-CM | POA: Diagnosis not present

## 2018-09-24 DIAGNOSIS — Z90722 Acquired absence of ovaries, bilateral: Secondary | ICD-10-CM | POA: Diagnosis not present

## 2018-09-24 DIAGNOSIS — Z171 Estrogen receptor negative status [ER-]: Secondary | ICD-10-CM | POA: Diagnosis not present

## 2018-09-24 DIAGNOSIS — Z90710 Acquired absence of both cervix and uterus: Secondary | ICD-10-CM | POA: Diagnosis not present

## 2018-09-24 DIAGNOSIS — Z48816 Encounter for surgical aftercare following surgery on the genitourinary system: Secondary | ICD-10-CM | POA: Diagnosis not present

## 2018-09-24 DIAGNOSIS — C50812 Malignant neoplasm of overlapping sites of left female breast: Secondary | ICD-10-CM | POA: Diagnosis not present

## 2018-09-24 DIAGNOSIS — R2689 Other abnormalities of gait and mobility: Secondary | ICD-10-CM | POA: Diagnosis not present

## 2018-09-26 DIAGNOSIS — Z48816 Encounter for surgical aftercare following surgery on the genitourinary system: Secondary | ICD-10-CM | POA: Diagnosis not present

## 2018-09-26 DIAGNOSIS — R2689 Other abnormalities of gait and mobility: Secondary | ICD-10-CM | POA: Diagnosis not present

## 2018-09-26 DIAGNOSIS — Z90710 Acquired absence of both cervix and uterus: Secondary | ICD-10-CM | POA: Diagnosis not present

## 2018-09-26 DIAGNOSIS — C50812 Malignant neoplasm of overlapping sites of left female breast: Secondary | ICD-10-CM | POA: Diagnosis not present

## 2018-09-26 DIAGNOSIS — Z171 Estrogen receptor negative status [ER-]: Secondary | ICD-10-CM | POA: Diagnosis not present

## 2018-09-26 DIAGNOSIS — Z90722 Acquired absence of ovaries, bilateral: Secondary | ICD-10-CM | POA: Diagnosis not present

## 2018-09-30 DIAGNOSIS — C50912 Malignant neoplasm of unspecified site of left female breast: Secondary | ICD-10-CM | POA: Diagnosis not present

## 2018-10-01 DIAGNOSIS — R2689 Other abnormalities of gait and mobility: Secondary | ICD-10-CM | POA: Diagnosis not present

## 2018-10-01 DIAGNOSIS — C50812 Malignant neoplasm of overlapping sites of left female breast: Secondary | ICD-10-CM | POA: Diagnosis not present

## 2018-10-01 DIAGNOSIS — Z90710 Acquired absence of both cervix and uterus: Secondary | ICD-10-CM | POA: Diagnosis not present

## 2018-10-01 DIAGNOSIS — Z48816 Encounter for surgical aftercare following surgery on the genitourinary system: Secondary | ICD-10-CM | POA: Diagnosis not present

## 2018-10-01 DIAGNOSIS — Z90722 Acquired absence of ovaries, bilateral: Secondary | ICD-10-CM | POA: Diagnosis not present

## 2018-10-01 DIAGNOSIS — Z171 Estrogen receptor negative status [ER-]: Secondary | ICD-10-CM | POA: Diagnosis not present

## 2018-10-02 DIAGNOSIS — Z4682 Encounter for fitting and adjustment of non-vascular catheter: Secondary | ICD-10-CM | POA: Diagnosis not present

## 2018-10-02 DIAGNOSIS — Z452 Encounter for adjustment and management of vascular access device: Secondary | ICD-10-CM | POA: Diagnosis not present

## 2018-10-02 DIAGNOSIS — C50912 Malignant neoplasm of unspecified site of left female breast: Secondary | ICD-10-CM | POA: Diagnosis not present

## 2018-10-02 DIAGNOSIS — I10 Essential (primary) hypertension: Secondary | ICD-10-CM | POA: Diagnosis not present

## 2018-10-02 DIAGNOSIS — E785 Hyperlipidemia, unspecified: Secondary | ICD-10-CM | POA: Diagnosis not present

## 2018-10-03 DIAGNOSIS — R2689 Other abnormalities of gait and mobility: Secondary | ICD-10-CM | POA: Diagnosis not present

## 2018-10-03 DIAGNOSIS — C50812 Malignant neoplasm of overlapping sites of left female breast: Secondary | ICD-10-CM | POA: Diagnosis not present

## 2018-10-03 DIAGNOSIS — Z48816 Encounter for surgical aftercare following surgery on the genitourinary system: Secondary | ICD-10-CM | POA: Diagnosis not present

## 2018-10-03 DIAGNOSIS — Z90722 Acquired absence of ovaries, bilateral: Secondary | ICD-10-CM | POA: Diagnosis not present

## 2018-10-03 DIAGNOSIS — Z171 Estrogen receptor negative status [ER-]: Secondary | ICD-10-CM | POA: Diagnosis not present

## 2018-10-03 DIAGNOSIS — Z90710 Acquired absence of both cervix and uterus: Secondary | ICD-10-CM | POA: Diagnosis not present

## 2018-10-06 DIAGNOSIS — F17200 Nicotine dependence, unspecified, uncomplicated: Secondary | ICD-10-CM | POA: Diagnosis not present

## 2018-10-06 DIAGNOSIS — C50612 Malignant neoplasm of axillary tail of left female breast: Secondary | ICD-10-CM | POA: Diagnosis not present

## 2018-10-06 DIAGNOSIS — Z171 Estrogen receptor negative status [ER-]: Secondary | ICD-10-CM | POA: Diagnosis not present

## 2018-10-06 DIAGNOSIS — C5702 Malignant neoplasm of left fallopian tube: Secondary | ICD-10-CM | POA: Diagnosis not present

## 2018-10-06 DIAGNOSIS — J90 Pleural effusion, not elsewhere classified: Secondary | ICD-10-CM | POA: Diagnosis not present

## 2018-10-09 DIAGNOSIS — C50812 Malignant neoplasm of overlapping sites of left female breast: Secondary | ICD-10-CM | POA: Diagnosis not present

## 2018-10-09 DIAGNOSIS — C5702 Malignant neoplasm of left fallopian tube: Secondary | ICD-10-CM | POA: Diagnosis not present

## 2018-10-09 DIAGNOSIS — Z171 Estrogen receptor negative status [ER-]: Secondary | ICD-10-CM | POA: Diagnosis not present

## 2018-10-14 DIAGNOSIS — Z5111 Encounter for antineoplastic chemotherapy: Secondary | ICD-10-CM | POA: Diagnosis not present

## 2018-10-14 DIAGNOSIS — Z171 Estrogen receptor negative status [ER-]: Secondary | ICD-10-CM | POA: Diagnosis not present

## 2018-10-14 DIAGNOSIS — C5702 Malignant neoplasm of left fallopian tube: Secondary | ICD-10-CM | POA: Diagnosis not present

## 2018-10-14 DIAGNOSIS — C50812 Malignant neoplasm of overlapping sites of left female breast: Secondary | ICD-10-CM | POA: Diagnosis not present

## 2018-10-21 DIAGNOSIS — T451X5D Adverse effect of antineoplastic and immunosuppressive drugs, subsequent encounter: Secondary | ICD-10-CM | POA: Diagnosis not present

## 2018-10-21 DIAGNOSIS — C50812 Malignant neoplasm of overlapping sites of left female breast: Secondary | ICD-10-CM | POA: Diagnosis not present

## 2018-10-21 DIAGNOSIS — Z171 Estrogen receptor negative status [ER-]: Secondary | ICD-10-CM | POA: Diagnosis not present

## 2018-10-21 DIAGNOSIS — C5702 Malignant neoplasm of left fallopian tube: Secondary | ICD-10-CM | POA: Diagnosis not present

## 2018-10-22 DIAGNOSIS — T451X5D Adverse effect of antineoplastic and immunosuppressive drugs, subsequent encounter: Secondary | ICD-10-CM | POA: Insufficient documentation

## 2018-10-23 DIAGNOSIS — C5702 Malignant neoplasm of left fallopian tube: Secondary | ICD-10-CM | POA: Diagnosis not present

## 2018-10-23 DIAGNOSIS — Z5111 Encounter for antineoplastic chemotherapy: Secondary | ICD-10-CM | POA: Diagnosis not present

## 2018-10-23 DIAGNOSIS — C50812 Malignant neoplasm of overlapping sites of left female breast: Secondary | ICD-10-CM | POA: Diagnosis not present

## 2018-10-23 DIAGNOSIS — Z171 Estrogen receptor negative status [ER-]: Secondary | ICD-10-CM | POA: Diagnosis not present

## 2018-10-28 DIAGNOSIS — N6320 Unspecified lump in the left breast, unspecified quadrant: Secondary | ICD-10-CM | POA: Diagnosis not present

## 2018-10-28 DIAGNOSIS — C50812 Malignant neoplasm of overlapping sites of left female breast: Secondary | ICD-10-CM | POA: Diagnosis not present

## 2018-10-28 DIAGNOSIS — C5702 Malignant neoplasm of left fallopian tube: Secondary | ICD-10-CM | POA: Diagnosis not present

## 2018-10-28 DIAGNOSIS — Z09 Encounter for follow-up examination after completed treatment for conditions other than malignant neoplasm: Secondary | ICD-10-CM | POA: Diagnosis not present

## 2018-10-28 DIAGNOSIS — Z171 Estrogen receptor negative status [ER-]: Secondary | ICD-10-CM | POA: Diagnosis not present

## 2018-10-30 DIAGNOSIS — C50812 Malignant neoplasm of overlapping sites of left female breast: Secondary | ICD-10-CM | POA: Diagnosis not present

## 2018-10-30 DIAGNOSIS — Z5111 Encounter for antineoplastic chemotherapy: Secondary | ICD-10-CM | POA: Diagnosis not present

## 2018-10-30 DIAGNOSIS — C5702 Malignant neoplasm of left fallopian tube: Secondary | ICD-10-CM | POA: Diagnosis not present

## 2018-10-30 DIAGNOSIS — Z171 Estrogen receptor negative status [ER-]: Secondary | ICD-10-CM | POA: Diagnosis not present

## 2018-11-04 DIAGNOSIS — Z171 Estrogen receptor negative status [ER-]: Secondary | ICD-10-CM | POA: Diagnosis not present

## 2018-11-04 DIAGNOSIS — C5702 Malignant neoplasm of left fallopian tube: Secondary | ICD-10-CM | POA: Diagnosis not present

## 2018-11-04 DIAGNOSIS — Z09 Encounter for follow-up examination after completed treatment for conditions other than malignant neoplasm: Secondary | ICD-10-CM | POA: Diagnosis not present

## 2018-11-04 DIAGNOSIS — C50812 Malignant neoplasm of overlapping sites of left female breast: Secondary | ICD-10-CM | POA: Diagnosis not present

## 2018-11-04 DIAGNOSIS — T451X5D Adverse effect of antineoplastic and immunosuppressive drugs, subsequent encounter: Secondary | ICD-10-CM | POA: Diagnosis not present

## 2018-11-06 DIAGNOSIS — Z171 Estrogen receptor negative status [ER-]: Secondary | ICD-10-CM | POA: Diagnosis not present

## 2018-11-06 DIAGNOSIS — Z5111 Encounter for antineoplastic chemotherapy: Secondary | ICD-10-CM | POA: Diagnosis not present

## 2018-11-06 DIAGNOSIS — C5702 Malignant neoplasm of left fallopian tube: Secondary | ICD-10-CM | POA: Diagnosis not present

## 2018-11-06 DIAGNOSIS — C50812 Malignant neoplasm of overlapping sites of left female breast: Secondary | ICD-10-CM | POA: Diagnosis not present

## 2018-11-11 DIAGNOSIS — C5702 Malignant neoplasm of left fallopian tube: Secondary | ICD-10-CM | POA: Diagnosis not present

## 2018-11-11 DIAGNOSIS — Z09 Encounter for follow-up examination after completed treatment for conditions other than malignant neoplasm: Secondary | ICD-10-CM | POA: Diagnosis not present

## 2018-11-11 DIAGNOSIS — Z171 Estrogen receptor negative status [ER-]: Secondary | ICD-10-CM | POA: Diagnosis not present

## 2018-11-11 DIAGNOSIS — C50812 Malignant neoplasm of overlapping sites of left female breast: Secondary | ICD-10-CM | POA: Diagnosis not present

## 2018-11-13 DIAGNOSIS — C5702 Malignant neoplasm of left fallopian tube: Secondary | ICD-10-CM | POA: Diagnosis not present

## 2018-11-13 DIAGNOSIS — C50812 Malignant neoplasm of overlapping sites of left female breast: Secondary | ICD-10-CM | POA: Diagnosis not present

## 2018-11-13 DIAGNOSIS — Z5111 Encounter for antineoplastic chemotherapy: Secondary | ICD-10-CM | POA: Diagnosis not present

## 2018-11-13 DIAGNOSIS — Z171 Estrogen receptor negative status [ER-]: Secondary | ICD-10-CM | POA: Diagnosis not present

## 2018-11-18 DIAGNOSIS — C50812 Malignant neoplasm of overlapping sites of left female breast: Secondary | ICD-10-CM | POA: Diagnosis not present

## 2018-11-18 DIAGNOSIS — Z171 Estrogen receptor negative status [ER-]: Secondary | ICD-10-CM | POA: Diagnosis not present

## 2018-11-18 DIAGNOSIS — C5702 Malignant neoplasm of left fallopian tube: Secondary | ICD-10-CM | POA: Diagnosis not present

## 2018-11-18 DIAGNOSIS — Z09 Encounter for follow-up examination after completed treatment for conditions other than malignant neoplasm: Secondary | ICD-10-CM | POA: Diagnosis not present

## 2018-11-20 DIAGNOSIS — C50812 Malignant neoplasm of overlapping sites of left female breast: Secondary | ICD-10-CM | POA: Diagnosis not present

## 2018-11-20 DIAGNOSIS — C5702 Malignant neoplasm of left fallopian tube: Secondary | ICD-10-CM | POA: Diagnosis not present

## 2018-11-20 DIAGNOSIS — Z171 Estrogen receptor negative status [ER-]: Secondary | ICD-10-CM | POA: Diagnosis not present

## 2018-11-20 DIAGNOSIS — Z5111 Encounter for antineoplastic chemotherapy: Secondary | ICD-10-CM | POA: Diagnosis not present

## 2018-11-25 DIAGNOSIS — C50812 Malignant neoplasm of overlapping sites of left female breast: Secondary | ICD-10-CM | POA: Diagnosis not present

## 2018-11-25 DIAGNOSIS — T451X5D Adverse effect of antineoplastic and immunosuppressive drugs, subsequent encounter: Secondary | ICD-10-CM | POA: Diagnosis not present

## 2018-11-25 DIAGNOSIS — Z09 Encounter for follow-up examination after completed treatment for conditions other than malignant neoplasm: Secondary | ICD-10-CM | POA: Diagnosis not present

## 2018-11-25 DIAGNOSIS — C5702 Malignant neoplasm of left fallopian tube: Secondary | ICD-10-CM | POA: Diagnosis not present

## 2018-11-25 DIAGNOSIS — Z171 Estrogen receptor negative status [ER-]: Secondary | ICD-10-CM | POA: Diagnosis not present

## 2018-11-27 DIAGNOSIS — Z5111 Encounter for antineoplastic chemotherapy: Secondary | ICD-10-CM | POA: Diagnosis not present

## 2018-11-27 DIAGNOSIS — Z171 Estrogen receptor negative status [ER-]: Secondary | ICD-10-CM | POA: Diagnosis not present

## 2018-11-27 DIAGNOSIS — C5702 Malignant neoplasm of left fallopian tube: Secondary | ICD-10-CM | POA: Diagnosis not present

## 2018-11-27 DIAGNOSIS — C50812 Malignant neoplasm of overlapping sites of left female breast: Secondary | ICD-10-CM | POA: Diagnosis not present

## 2018-12-02 DIAGNOSIS — Z171 Estrogen receptor negative status [ER-]: Secondary | ICD-10-CM | POA: Diagnosis not present

## 2018-12-02 DIAGNOSIS — Z09 Encounter for follow-up examination after completed treatment for conditions other than malignant neoplasm: Secondary | ICD-10-CM | POA: Diagnosis not present

## 2018-12-02 DIAGNOSIS — C5702 Malignant neoplasm of left fallopian tube: Secondary | ICD-10-CM | POA: Diagnosis not present

## 2018-12-02 DIAGNOSIS — T451X5D Adverse effect of antineoplastic and immunosuppressive drugs, subsequent encounter: Secondary | ICD-10-CM | POA: Diagnosis not present

## 2018-12-02 DIAGNOSIS — C50812 Malignant neoplasm of overlapping sites of left female breast: Secondary | ICD-10-CM | POA: Diagnosis not present

## 2018-12-04 DIAGNOSIS — Z5111 Encounter for antineoplastic chemotherapy: Secondary | ICD-10-CM | POA: Diagnosis not present

## 2018-12-04 DIAGNOSIS — C5702 Malignant neoplasm of left fallopian tube: Secondary | ICD-10-CM | POA: Diagnosis not present

## 2018-12-04 DIAGNOSIS — Z171 Estrogen receptor negative status [ER-]: Secondary | ICD-10-CM | POA: Diagnosis not present

## 2018-12-04 DIAGNOSIS — C50812 Malignant neoplasm of overlapping sites of left female breast: Secondary | ICD-10-CM | POA: Diagnosis not present

## 2018-12-09 DIAGNOSIS — Z171 Estrogen receptor negative status [ER-]: Secondary | ICD-10-CM | POA: Diagnosis not present

## 2018-12-09 DIAGNOSIS — T451X5D Adverse effect of antineoplastic and immunosuppressive drugs, subsequent encounter: Secondary | ICD-10-CM | POA: Diagnosis not present

## 2018-12-09 DIAGNOSIS — C50812 Malignant neoplasm of overlapping sites of left female breast: Secondary | ICD-10-CM | POA: Diagnosis not present

## 2018-12-09 DIAGNOSIS — C5702 Malignant neoplasm of left fallopian tube: Secondary | ICD-10-CM | POA: Diagnosis not present

## 2018-12-09 DIAGNOSIS — Z09 Encounter for follow-up examination after completed treatment for conditions other than malignant neoplasm: Secondary | ICD-10-CM | POA: Diagnosis not present

## 2018-12-11 DIAGNOSIS — C50812 Malignant neoplasm of overlapping sites of left female breast: Secondary | ICD-10-CM | POA: Diagnosis not present

## 2018-12-11 DIAGNOSIS — C5702 Malignant neoplasm of left fallopian tube: Secondary | ICD-10-CM | POA: Diagnosis not present

## 2018-12-11 DIAGNOSIS — Z171 Estrogen receptor negative status [ER-]: Secondary | ICD-10-CM | POA: Diagnosis not present

## 2018-12-11 DIAGNOSIS — Z5111 Encounter for antineoplastic chemotherapy: Secondary | ICD-10-CM | POA: Diagnosis not present

## 2018-12-16 DIAGNOSIS — C50812 Malignant neoplasm of overlapping sites of left female breast: Secondary | ICD-10-CM | POA: Diagnosis not present

## 2018-12-16 DIAGNOSIS — Z09 Encounter for follow-up examination after completed treatment for conditions other than malignant neoplasm: Secondary | ICD-10-CM | POA: Diagnosis not present

## 2018-12-16 DIAGNOSIS — Z79899 Other long term (current) drug therapy: Secondary | ICD-10-CM | POA: Diagnosis not present

## 2018-12-16 DIAGNOSIS — T451X5D Adverse effect of antineoplastic and immunosuppressive drugs, subsequent encounter: Secondary | ICD-10-CM | POA: Diagnosis not present

## 2018-12-16 DIAGNOSIS — C5702 Malignant neoplasm of left fallopian tube: Secondary | ICD-10-CM | POA: Diagnosis not present

## 2018-12-16 DIAGNOSIS — Z5181 Encounter for therapeutic drug level monitoring: Secondary | ICD-10-CM | POA: Diagnosis not present

## 2018-12-16 DIAGNOSIS — Z171 Estrogen receptor negative status [ER-]: Secondary | ICD-10-CM | POA: Diagnosis not present

## 2018-12-17 DIAGNOSIS — N9489 Other specified conditions associated with female genital organs and menstrual cycle: Secondary | ICD-10-CM | POA: Diagnosis not present

## 2018-12-17 DIAGNOSIS — C5702 Malignant neoplasm of left fallopian tube: Secondary | ICD-10-CM | POA: Diagnosis not present

## 2018-12-18 DIAGNOSIS — Z5111 Encounter for antineoplastic chemotherapy: Secondary | ICD-10-CM | POA: Diagnosis not present

## 2018-12-18 DIAGNOSIS — C5702 Malignant neoplasm of left fallopian tube: Secondary | ICD-10-CM | POA: Diagnosis not present

## 2018-12-18 DIAGNOSIS — C50812 Malignant neoplasm of overlapping sites of left female breast: Secondary | ICD-10-CM | POA: Diagnosis not present

## 2018-12-18 DIAGNOSIS — Z171 Estrogen receptor negative status [ER-]: Secondary | ICD-10-CM | POA: Diagnosis not present

## 2018-12-23 DIAGNOSIS — Z09 Encounter for follow-up examination after completed treatment for conditions other than malignant neoplasm: Secondary | ICD-10-CM | POA: Diagnosis not present

## 2018-12-23 DIAGNOSIS — Z171 Estrogen receptor negative status [ER-]: Secondary | ICD-10-CM | POA: Diagnosis not present

## 2018-12-23 DIAGNOSIS — C50812 Malignant neoplasm of overlapping sites of left female breast: Secondary | ICD-10-CM | POA: Diagnosis not present

## 2018-12-23 DIAGNOSIS — C5702 Malignant neoplasm of left fallopian tube: Secondary | ICD-10-CM | POA: Diagnosis not present

## 2018-12-23 DIAGNOSIS — K521 Toxic gastroenteritis and colitis: Secondary | ICD-10-CM | POA: Diagnosis not present

## 2018-12-23 DIAGNOSIS — T451X5D Adverse effect of antineoplastic and immunosuppressive drugs, subsequent encounter: Secondary | ICD-10-CM | POA: Diagnosis not present

## 2018-12-23 DIAGNOSIS — T451X5A Adverse effect of antineoplastic and immunosuppressive drugs, initial encounter: Secondary | ICD-10-CM | POA: Diagnosis not present

## 2018-12-25 DIAGNOSIS — C5702 Malignant neoplasm of left fallopian tube: Secondary | ICD-10-CM | POA: Diagnosis not present

## 2018-12-25 DIAGNOSIS — Z171 Estrogen receptor negative status [ER-]: Secondary | ICD-10-CM | POA: Diagnosis not present

## 2018-12-25 DIAGNOSIS — Z5111 Encounter for antineoplastic chemotherapy: Secondary | ICD-10-CM | POA: Diagnosis not present

## 2018-12-25 DIAGNOSIS — C50812 Malignant neoplasm of overlapping sites of left female breast: Secondary | ICD-10-CM | POA: Diagnosis not present

## 2018-12-26 DIAGNOSIS — K521 Toxic gastroenteritis and colitis: Secondary | ICD-10-CM | POA: Insufficient documentation

## 2018-12-30 DIAGNOSIS — Z09 Encounter for follow-up examination after completed treatment for conditions other than malignant neoplasm: Secondary | ICD-10-CM | POA: Diagnosis not present

## 2018-12-30 DIAGNOSIS — T451X5A Adverse effect of antineoplastic and immunosuppressive drugs, initial encounter: Secondary | ICD-10-CM | POA: Diagnosis not present

## 2018-12-30 DIAGNOSIS — Z171 Estrogen receptor negative status [ER-]: Secondary | ICD-10-CM | POA: Diagnosis not present

## 2018-12-30 DIAGNOSIS — T451X5D Adverse effect of antineoplastic and immunosuppressive drugs, subsequent encounter: Secondary | ICD-10-CM | POA: Diagnosis not present

## 2018-12-30 DIAGNOSIS — C50812 Malignant neoplasm of overlapping sites of left female breast: Secondary | ICD-10-CM | POA: Diagnosis not present

## 2018-12-30 DIAGNOSIS — C5702 Malignant neoplasm of left fallopian tube: Secondary | ICD-10-CM | POA: Diagnosis not present

## 2018-12-30 DIAGNOSIS — K521 Toxic gastroenteritis and colitis: Secondary | ICD-10-CM | POA: Diagnosis not present

## 2018-12-31 DIAGNOSIS — C5702 Malignant neoplasm of left fallopian tube: Secondary | ICD-10-CM | POA: Diagnosis not present

## 2019-01-06 DIAGNOSIS — K521 Toxic gastroenteritis and colitis: Secondary | ICD-10-CM | POA: Diagnosis not present

## 2019-01-06 DIAGNOSIS — C50812 Malignant neoplasm of overlapping sites of left female breast: Secondary | ICD-10-CM | POA: Diagnosis not present

## 2019-01-06 DIAGNOSIS — Z171 Estrogen receptor negative status [ER-]: Secondary | ICD-10-CM | POA: Diagnosis not present

## 2019-01-06 DIAGNOSIS — T451X5D Adverse effect of antineoplastic and immunosuppressive drugs, subsequent encounter: Secondary | ICD-10-CM | POA: Diagnosis not present

## 2019-01-06 DIAGNOSIS — T451X5A Adverse effect of antineoplastic and immunosuppressive drugs, initial encounter: Secondary | ICD-10-CM | POA: Diagnosis not present

## 2019-01-06 DIAGNOSIS — Z09 Encounter for follow-up examination after completed treatment for conditions other than malignant neoplasm: Secondary | ICD-10-CM | POA: Diagnosis not present

## 2019-01-06 DIAGNOSIS — C5702 Malignant neoplasm of left fallopian tube: Secondary | ICD-10-CM | POA: Diagnosis not present

## 2019-01-08 DIAGNOSIS — Z171 Estrogen receptor negative status [ER-]: Secondary | ICD-10-CM | POA: Diagnosis not present

## 2019-01-08 DIAGNOSIS — T451X5A Adverse effect of antineoplastic and immunosuppressive drugs, initial encounter: Secondary | ICD-10-CM | POA: Diagnosis not present

## 2019-01-08 DIAGNOSIS — C50812 Malignant neoplasm of overlapping sites of left female breast: Secondary | ICD-10-CM | POA: Diagnosis not present

## 2019-01-08 DIAGNOSIS — K521 Toxic gastroenteritis and colitis: Secondary | ICD-10-CM | POA: Diagnosis not present

## 2019-01-08 DIAGNOSIS — Z5111 Encounter for antineoplastic chemotherapy: Secondary | ICD-10-CM | POA: Diagnosis not present

## 2019-01-08 DIAGNOSIS — C5702 Malignant neoplasm of left fallopian tube: Secondary | ICD-10-CM | POA: Diagnosis not present

## 2019-01-13 DIAGNOSIS — Z9049 Acquired absence of other specified parts of digestive tract: Secondary | ICD-10-CM | POA: Diagnosis not present

## 2019-01-13 DIAGNOSIS — C5701 Malignant neoplasm of right fallopian tube: Secondary | ICD-10-CM | POA: Diagnosis not present

## 2019-01-13 DIAGNOSIS — R971 Elevated cancer antigen 125 [CA 125]: Secondary | ICD-10-CM | POA: Diagnosis not present

## 2019-01-13 DIAGNOSIS — T451X5D Adverse effect of antineoplastic and immunosuppressive drugs, subsequent encounter: Secondary | ICD-10-CM | POA: Diagnosis not present

## 2019-01-13 DIAGNOSIS — C561 Malignant neoplasm of right ovary: Secondary | ICD-10-CM | POA: Diagnosis not present

## 2019-01-13 DIAGNOSIS — C5702 Malignant neoplasm of left fallopian tube: Secondary | ICD-10-CM | POA: Diagnosis not present

## 2019-01-13 DIAGNOSIS — K521 Toxic gastroenteritis and colitis: Secondary | ICD-10-CM | POA: Diagnosis not present

## 2019-01-13 DIAGNOSIS — Z720 Tobacco use: Secondary | ICD-10-CM | POA: Diagnosis not present

## 2019-01-13 DIAGNOSIS — Z86010 Personal history of colonic polyps: Secondary | ICD-10-CM | POA: Diagnosis not present

## 2019-01-13 DIAGNOSIS — Z171 Estrogen receptor negative status [ER-]: Secondary | ICD-10-CM | POA: Diagnosis not present

## 2019-01-13 DIAGNOSIS — E785 Hyperlipidemia, unspecified: Secondary | ICD-10-CM | POA: Diagnosis not present

## 2019-01-13 DIAGNOSIS — Z90710 Acquired absence of both cervix and uterus: Secondary | ICD-10-CM | POA: Diagnosis not present

## 2019-01-13 DIAGNOSIS — R600 Localized edema: Secondary | ICD-10-CM | POA: Diagnosis not present

## 2019-01-13 DIAGNOSIS — Z90722 Acquired absence of ovaries, bilateral: Secondary | ICD-10-CM | POA: Diagnosis not present

## 2019-01-13 DIAGNOSIS — Z09 Encounter for follow-up examination after completed treatment for conditions other than malignant neoplasm: Secondary | ICD-10-CM | POA: Diagnosis not present

## 2019-01-13 DIAGNOSIS — Z7289 Other problems related to lifestyle: Secondary | ICD-10-CM | POA: Diagnosis not present

## 2019-01-13 DIAGNOSIS — C50812 Malignant neoplasm of overlapping sites of left female breast: Secondary | ICD-10-CM | POA: Diagnosis not present

## 2019-01-13 DIAGNOSIS — I10 Essential (primary) hypertension: Secondary | ICD-10-CM | POA: Diagnosis not present

## 2019-01-15 DIAGNOSIS — Z171 Estrogen receptor negative status [ER-]: Secondary | ICD-10-CM | POA: Diagnosis not present

## 2019-01-15 DIAGNOSIS — C50812 Malignant neoplasm of overlapping sites of left female breast: Secondary | ICD-10-CM | POA: Diagnosis not present

## 2019-01-15 DIAGNOSIS — Z5111 Encounter for antineoplastic chemotherapy: Secondary | ICD-10-CM | POA: Diagnosis not present

## 2019-01-15 DIAGNOSIS — C5702 Malignant neoplasm of left fallopian tube: Secondary | ICD-10-CM | POA: Diagnosis not present

## 2019-01-20 DIAGNOSIS — Z171 Estrogen receptor negative status [ER-]: Secondary | ICD-10-CM | POA: Diagnosis not present

## 2019-01-20 DIAGNOSIS — R600 Localized edema: Secondary | ICD-10-CM | POA: Diagnosis not present

## 2019-01-20 DIAGNOSIS — C50812 Malignant neoplasm of overlapping sites of left female breast: Secondary | ICD-10-CM | POA: Diagnosis not present

## 2019-01-20 DIAGNOSIS — I517 Cardiomegaly: Secondary | ICD-10-CM | POA: Diagnosis not present

## 2019-01-21 DIAGNOSIS — Z0001 Encounter for general adult medical examination with abnormal findings: Secondary | ICD-10-CM | POA: Diagnosis not present

## 2019-01-21 DIAGNOSIS — E782 Mixed hyperlipidemia: Secondary | ICD-10-CM | POA: Diagnosis not present

## 2019-01-21 DIAGNOSIS — Z1389 Encounter for screening for other disorder: Secondary | ICD-10-CM | POA: Diagnosis not present

## 2019-01-21 DIAGNOSIS — Z6834 Body mass index (BMI) 34.0-34.9, adult: Secondary | ICD-10-CM | POA: Diagnosis not present

## 2019-01-27 DIAGNOSIS — Z171 Estrogen receptor negative status [ER-]: Secondary | ICD-10-CM | POA: Diagnosis not present

## 2019-01-27 DIAGNOSIS — C5702 Malignant neoplasm of left fallopian tube: Secondary | ICD-10-CM | POA: Diagnosis not present

## 2019-01-27 DIAGNOSIS — Z09 Encounter for follow-up examination after completed treatment for conditions other than malignant neoplasm: Secondary | ICD-10-CM | POA: Diagnosis not present

## 2019-01-27 DIAGNOSIS — C50812 Malignant neoplasm of overlapping sites of left female breast: Secondary | ICD-10-CM | POA: Diagnosis not present

## 2019-01-27 DIAGNOSIS — T451X5D Adverse effect of antineoplastic and immunosuppressive drugs, subsequent encounter: Secondary | ICD-10-CM | POA: Diagnosis not present

## 2019-01-29 DIAGNOSIS — Z171 Estrogen receptor negative status [ER-]: Secondary | ICD-10-CM | POA: Diagnosis not present

## 2019-01-29 DIAGNOSIS — C50812 Malignant neoplasm of overlapping sites of left female breast: Secondary | ICD-10-CM | POA: Diagnosis not present

## 2019-01-29 DIAGNOSIS — C5702 Malignant neoplasm of left fallopian tube: Secondary | ICD-10-CM | POA: Diagnosis not present

## 2019-02-04 DIAGNOSIS — C5702 Malignant neoplasm of left fallopian tube: Secondary | ICD-10-CM | POA: Diagnosis not present

## 2019-02-04 DIAGNOSIS — Z171 Estrogen receptor negative status [ER-]: Secondary | ICD-10-CM | POA: Diagnosis not present

## 2019-02-04 DIAGNOSIS — Z09 Encounter for follow-up examination after completed treatment for conditions other than malignant neoplasm: Secondary | ICD-10-CM | POA: Diagnosis not present

## 2019-02-04 DIAGNOSIS — C50812 Malignant neoplasm of overlapping sites of left female breast: Secondary | ICD-10-CM | POA: Diagnosis not present

## 2019-02-04 DIAGNOSIS — K521 Toxic gastroenteritis and colitis: Secondary | ICD-10-CM | POA: Diagnosis not present

## 2019-02-04 DIAGNOSIS — T451X5A Adverse effect of antineoplastic and immunosuppressive drugs, initial encounter: Secondary | ICD-10-CM | POA: Diagnosis not present

## 2019-02-04 DIAGNOSIS — T451X5D Adverse effect of antineoplastic and immunosuppressive drugs, subsequent encounter: Secondary | ICD-10-CM | POA: Diagnosis not present

## 2019-02-05 DIAGNOSIS — C5702 Malignant neoplasm of left fallopian tube: Secondary | ICD-10-CM | POA: Diagnosis not present

## 2019-02-09 DIAGNOSIS — Z171 Estrogen receptor negative status [ER-]: Secondary | ICD-10-CM | POA: Diagnosis not present

## 2019-02-09 DIAGNOSIS — Z09 Encounter for follow-up examination after completed treatment for conditions other than malignant neoplasm: Secondary | ICD-10-CM | POA: Diagnosis not present

## 2019-02-09 DIAGNOSIS — T451X5D Adverse effect of antineoplastic and immunosuppressive drugs, subsequent encounter: Secondary | ICD-10-CM | POA: Diagnosis not present

## 2019-02-09 DIAGNOSIS — C50812 Malignant neoplasm of overlapping sites of left female breast: Secondary | ICD-10-CM | POA: Diagnosis not present

## 2019-02-09 DIAGNOSIS — C5702 Malignant neoplasm of left fallopian tube: Secondary | ICD-10-CM | POA: Diagnosis not present

## 2019-02-10 DIAGNOSIS — K521 Toxic gastroenteritis and colitis: Secondary | ICD-10-CM | POA: Diagnosis not present

## 2019-02-10 DIAGNOSIS — Z09 Encounter for follow-up examination after completed treatment for conditions other than malignant neoplasm: Secondary | ICD-10-CM | POA: Diagnosis not present

## 2019-02-10 DIAGNOSIS — Z171 Estrogen receptor negative status [ER-]: Secondary | ICD-10-CM | POA: Diagnosis not present

## 2019-02-10 DIAGNOSIS — T50995S Adverse effect of other drugs, medicaments and biological substances, sequela: Secondary | ICD-10-CM | POA: Diagnosis not present

## 2019-02-10 DIAGNOSIS — T451X5A Adverse effect of antineoplastic and immunosuppressive drugs, initial encounter: Secondary | ICD-10-CM | POA: Diagnosis not present

## 2019-02-10 DIAGNOSIS — C5702 Malignant neoplasm of left fallopian tube: Secondary | ICD-10-CM | POA: Diagnosis not present

## 2019-02-10 DIAGNOSIS — C50812 Malignant neoplasm of overlapping sites of left female breast: Secondary | ICD-10-CM | POA: Diagnosis not present

## 2019-02-11 DIAGNOSIS — Z09 Encounter for follow-up examination after completed treatment for conditions other than malignant neoplasm: Secondary | ICD-10-CM | POA: Diagnosis not present

## 2019-02-11 DIAGNOSIS — C50812 Malignant neoplasm of overlapping sites of left female breast: Secondary | ICD-10-CM | POA: Diagnosis not present

## 2019-02-11 DIAGNOSIS — T451X5D Adverse effect of antineoplastic and immunosuppressive drugs, subsequent encounter: Secondary | ICD-10-CM | POA: Diagnosis not present

## 2019-02-11 DIAGNOSIS — Z171 Estrogen receptor negative status [ER-]: Secondary | ICD-10-CM | POA: Diagnosis not present

## 2019-02-11 DIAGNOSIS — C5702 Malignant neoplasm of left fallopian tube: Secondary | ICD-10-CM | POA: Diagnosis not present

## 2019-02-12 DIAGNOSIS — T451X5A Adverse effect of antineoplastic and immunosuppressive drugs, initial encounter: Secondary | ICD-10-CM | POA: Diagnosis not present

## 2019-02-12 DIAGNOSIS — C5702 Malignant neoplasm of left fallopian tube: Secondary | ICD-10-CM | POA: Diagnosis not present

## 2019-02-12 DIAGNOSIS — T50995S Adverse effect of other drugs, medicaments and biological substances, sequela: Secondary | ICD-10-CM | POA: Insufficient documentation

## 2019-02-12 DIAGNOSIS — K521 Toxic gastroenteritis and colitis: Secondary | ICD-10-CM | POA: Diagnosis not present

## 2019-02-13 DIAGNOSIS — T451X5A Adverse effect of antineoplastic and immunosuppressive drugs, initial encounter: Secondary | ICD-10-CM | POA: Diagnosis not present

## 2019-02-13 DIAGNOSIS — T451X5D Adverse effect of antineoplastic and immunosuppressive drugs, subsequent encounter: Secondary | ICD-10-CM | POA: Diagnosis not present

## 2019-02-13 DIAGNOSIS — Z09 Encounter for follow-up examination after completed treatment for conditions other than malignant neoplasm: Secondary | ICD-10-CM | POA: Diagnosis not present

## 2019-02-13 DIAGNOSIS — K521 Toxic gastroenteritis and colitis: Secondary | ICD-10-CM | POA: Diagnosis not present

## 2019-02-13 DIAGNOSIS — C50812 Malignant neoplasm of overlapping sites of left female breast: Secondary | ICD-10-CM | POA: Diagnosis not present

## 2019-02-13 DIAGNOSIS — C5702 Malignant neoplasm of left fallopian tube: Secondary | ICD-10-CM | POA: Diagnosis not present

## 2019-02-13 DIAGNOSIS — Z171 Estrogen receptor negative status [ER-]: Secondary | ICD-10-CM | POA: Diagnosis not present

## 2019-02-16 DIAGNOSIS — Z09 Encounter for follow-up examination after completed treatment for conditions other than malignant neoplasm: Secondary | ICD-10-CM | POA: Diagnosis not present

## 2019-02-16 DIAGNOSIS — T451X5D Adverse effect of antineoplastic and immunosuppressive drugs, subsequent encounter: Secondary | ICD-10-CM | POA: Diagnosis not present

## 2019-02-16 DIAGNOSIS — Z171 Estrogen receptor negative status [ER-]: Secondary | ICD-10-CM | POA: Diagnosis not present

## 2019-02-16 DIAGNOSIS — T451X5A Adverse effect of antineoplastic and immunosuppressive drugs, initial encounter: Secondary | ICD-10-CM | POA: Diagnosis not present

## 2019-02-16 DIAGNOSIS — C50812 Malignant neoplasm of overlapping sites of left female breast: Secondary | ICD-10-CM | POA: Diagnosis not present

## 2019-02-16 DIAGNOSIS — C5702 Malignant neoplasm of left fallopian tube: Secondary | ICD-10-CM | POA: Diagnosis not present

## 2019-02-16 DIAGNOSIS — K521 Toxic gastroenteritis and colitis: Secondary | ICD-10-CM | POA: Diagnosis not present

## 2019-02-18 DIAGNOSIS — Z09 Encounter for follow-up examination after completed treatment for conditions other than malignant neoplasm: Secondary | ICD-10-CM | POA: Diagnosis not present

## 2019-02-18 DIAGNOSIS — C5702 Malignant neoplasm of left fallopian tube: Secondary | ICD-10-CM | POA: Diagnosis not present

## 2019-02-18 DIAGNOSIS — C50812 Malignant neoplasm of overlapping sites of left female breast: Secondary | ICD-10-CM | POA: Diagnosis not present

## 2019-02-18 DIAGNOSIS — Z171 Estrogen receptor negative status [ER-]: Secondary | ICD-10-CM | POA: Diagnosis not present

## 2019-02-18 DIAGNOSIS — T451X5D Adverse effect of antineoplastic and immunosuppressive drugs, subsequent encounter: Secondary | ICD-10-CM | POA: Diagnosis not present

## 2019-02-19 DIAGNOSIS — T50995S Adverse effect of other drugs, medicaments and biological substances, sequela: Secondary | ICD-10-CM | POA: Diagnosis not present

## 2019-02-19 DIAGNOSIS — C50812 Malignant neoplasm of overlapping sites of left female breast: Secondary | ICD-10-CM | POA: Diagnosis not present

## 2019-02-19 DIAGNOSIS — K521 Toxic gastroenteritis and colitis: Secondary | ICD-10-CM | POA: Diagnosis not present

## 2019-02-19 DIAGNOSIS — Z171 Estrogen receptor negative status [ER-]: Secondary | ICD-10-CM | POA: Diagnosis not present

## 2019-02-19 DIAGNOSIS — T451X5A Adverse effect of antineoplastic and immunosuppressive drugs, initial encounter: Secondary | ICD-10-CM | POA: Diagnosis not present

## 2019-02-19 DIAGNOSIS — Z5111 Encounter for antineoplastic chemotherapy: Secondary | ICD-10-CM | POA: Diagnosis not present

## 2019-02-19 DIAGNOSIS — C5702 Malignant neoplasm of left fallopian tube: Secondary | ICD-10-CM | POA: Diagnosis not present

## 2019-02-19 DIAGNOSIS — T451X5D Adverse effect of antineoplastic and immunosuppressive drugs, subsequent encounter: Secondary | ICD-10-CM | POA: Diagnosis not present

## 2019-02-19 DIAGNOSIS — Z7689 Persons encountering health services in other specified circumstances: Secondary | ICD-10-CM | POA: Diagnosis not present

## 2019-02-19 DIAGNOSIS — Z09 Encounter for follow-up examination after completed treatment for conditions other than malignant neoplasm: Secondary | ICD-10-CM | POA: Diagnosis not present

## 2019-02-26 DIAGNOSIS — C5701 Malignant neoplasm of right fallopian tube: Secondary | ICD-10-CM | POA: Diagnosis not present

## 2019-02-26 DIAGNOSIS — T50995S Adverse effect of other drugs, medicaments and biological substances, sequela: Secondary | ICD-10-CM | POA: Diagnosis not present

## 2019-02-26 DIAGNOSIS — I10 Essential (primary) hypertension: Secondary | ICD-10-CM | POA: Diagnosis not present

## 2019-02-26 DIAGNOSIS — E785 Hyperlipidemia, unspecified: Secondary | ICD-10-CM | POA: Diagnosis not present

## 2019-02-26 DIAGNOSIS — Z171 Estrogen receptor negative status [ER-]: Secondary | ICD-10-CM | POA: Diagnosis not present

## 2019-02-26 DIAGNOSIS — Z9049 Acquired absence of other specified parts of digestive tract: Secondary | ICD-10-CM | POA: Diagnosis not present

## 2019-02-26 DIAGNOSIS — Z86010 Personal history of colonic polyps: Secondary | ICD-10-CM | POA: Diagnosis not present

## 2019-02-26 DIAGNOSIS — C50812 Malignant neoplasm of overlapping sites of left female breast: Secondary | ICD-10-CM | POA: Diagnosis not present

## 2019-02-26 DIAGNOSIS — Z09 Encounter for follow-up examination after completed treatment for conditions other than malignant neoplasm: Secondary | ICD-10-CM | POA: Diagnosis not present

## 2019-02-26 DIAGNOSIS — Z9221 Personal history of antineoplastic chemotherapy: Secondary | ICD-10-CM | POA: Diagnosis not present

## 2019-02-26 DIAGNOSIS — Z720 Tobacco use: Secondary | ICD-10-CM | POA: Diagnosis not present

## 2019-02-26 DIAGNOSIS — Z90721 Acquired absence of ovaries, unilateral: Secondary | ICD-10-CM | POA: Diagnosis not present

## 2019-02-26 DIAGNOSIS — C5702 Malignant neoplasm of left fallopian tube: Secondary | ICD-10-CM | POA: Diagnosis not present

## 2019-02-26 DIAGNOSIS — E860 Dehydration: Secondary | ICD-10-CM | POA: Diagnosis not present

## 2019-02-26 DIAGNOSIS — T451X5D Adverse effect of antineoplastic and immunosuppressive drugs, subsequent encounter: Secondary | ICD-10-CM | POA: Diagnosis not present

## 2019-02-27 DIAGNOSIS — C5702 Malignant neoplasm of left fallopian tube: Secondary | ICD-10-CM | POA: Diagnosis not present

## 2019-02-27 DIAGNOSIS — T451X5A Adverse effect of antineoplastic and immunosuppressive drugs, initial encounter: Secondary | ICD-10-CM | POA: Diagnosis not present

## 2019-02-27 DIAGNOSIS — K521 Toxic gastroenteritis and colitis: Secondary | ICD-10-CM | POA: Diagnosis not present

## 2019-03-02 DIAGNOSIS — C5702 Malignant neoplasm of left fallopian tube: Secondary | ICD-10-CM | POA: Diagnosis not present

## 2019-03-02 DIAGNOSIS — C50812 Malignant neoplasm of overlapping sites of left female breast: Secondary | ICD-10-CM | POA: Diagnosis not present

## 2019-03-02 DIAGNOSIS — Z171 Estrogen receptor negative status [ER-]: Secondary | ICD-10-CM | POA: Diagnosis not present

## 2019-03-10 DIAGNOSIS — T451X5A Adverse effect of antineoplastic and immunosuppressive drugs, initial encounter: Secondary | ICD-10-CM | POA: Diagnosis not present

## 2019-03-10 DIAGNOSIS — Z171 Estrogen receptor negative status [ER-]: Secondary | ICD-10-CM | POA: Diagnosis not present

## 2019-03-10 DIAGNOSIS — T50995S Adverse effect of other drugs, medicaments and biological substances, sequela: Secondary | ICD-10-CM | POA: Diagnosis not present

## 2019-03-10 DIAGNOSIS — Z09 Encounter for follow-up examination after completed treatment for conditions other than malignant neoplasm: Secondary | ICD-10-CM | POA: Diagnosis not present

## 2019-03-10 DIAGNOSIS — T451X5D Adverse effect of antineoplastic and immunosuppressive drugs, subsequent encounter: Secondary | ICD-10-CM | POA: Diagnosis not present

## 2019-03-10 DIAGNOSIS — K521 Toxic gastroenteritis and colitis: Secondary | ICD-10-CM | POA: Diagnosis not present

## 2019-03-10 DIAGNOSIS — C5702 Malignant neoplasm of left fallopian tube: Secondary | ICD-10-CM | POA: Diagnosis not present

## 2019-03-10 DIAGNOSIS — C50812 Malignant neoplasm of overlapping sites of left female breast: Secondary | ICD-10-CM | POA: Diagnosis not present

## 2019-03-12 DIAGNOSIS — C5702 Malignant neoplasm of left fallopian tube: Secondary | ICD-10-CM | POA: Diagnosis not present

## 2019-03-12 DIAGNOSIS — Z171 Estrogen receptor negative status [ER-]: Secondary | ICD-10-CM | POA: Diagnosis not present

## 2019-03-12 DIAGNOSIS — C50812 Malignant neoplasm of overlapping sites of left female breast: Secondary | ICD-10-CM | POA: Diagnosis not present

## 2019-03-13 DIAGNOSIS — K521 Toxic gastroenteritis and colitis: Secondary | ICD-10-CM | POA: Diagnosis not present

## 2019-03-13 DIAGNOSIS — Z171 Estrogen receptor negative status [ER-]: Secondary | ICD-10-CM | POA: Diagnosis not present

## 2019-03-13 DIAGNOSIS — C5702 Malignant neoplasm of left fallopian tube: Secondary | ICD-10-CM | POA: Diagnosis not present

## 2019-03-13 DIAGNOSIS — C50812 Malignant neoplasm of overlapping sites of left female breast: Secondary | ICD-10-CM | POA: Diagnosis not present

## 2019-03-13 DIAGNOSIS — T451X5A Adverse effect of antineoplastic and immunosuppressive drugs, initial encounter: Secondary | ICD-10-CM | POA: Diagnosis not present

## 2019-03-16 DIAGNOSIS — T50995S Adverse effect of other drugs, medicaments and biological substances, sequela: Secondary | ICD-10-CM | POA: Diagnosis not present

## 2019-03-16 DIAGNOSIS — Z171 Estrogen receptor negative status [ER-]: Secondary | ICD-10-CM | POA: Diagnosis not present

## 2019-03-16 DIAGNOSIS — E860 Dehydration: Secondary | ICD-10-CM | POA: Diagnosis not present

## 2019-03-16 DIAGNOSIS — T451X5A Adverse effect of antineoplastic and immunosuppressive drugs, initial encounter: Secondary | ICD-10-CM | POA: Diagnosis not present

## 2019-03-16 DIAGNOSIS — C50812 Malignant neoplasm of overlapping sites of left female breast: Secondary | ICD-10-CM | POA: Diagnosis not present

## 2019-03-16 DIAGNOSIS — Z09 Encounter for follow-up examination after completed treatment for conditions other than malignant neoplasm: Secondary | ICD-10-CM | POA: Diagnosis not present

## 2019-03-16 DIAGNOSIS — C5702 Malignant neoplasm of left fallopian tube: Secondary | ICD-10-CM | POA: Diagnosis not present

## 2019-03-16 DIAGNOSIS — K521 Toxic gastroenteritis and colitis: Secondary | ICD-10-CM | POA: Diagnosis not present

## 2019-03-16 DIAGNOSIS — T451X5D Adverse effect of antineoplastic and immunosuppressive drugs, subsequent encounter: Secondary | ICD-10-CM | POA: Diagnosis not present

## 2019-03-18 DIAGNOSIS — D701 Agranulocytosis secondary to cancer chemotherapy: Secondary | ICD-10-CM | POA: Diagnosis not present

## 2019-03-18 DIAGNOSIS — E860 Dehydration: Secondary | ICD-10-CM | POA: Diagnosis not present

## 2019-03-18 DIAGNOSIS — E876 Hypokalemia: Secondary | ICD-10-CM | POA: Diagnosis not present

## 2019-03-18 DIAGNOSIS — C773 Secondary and unspecified malignant neoplasm of axilla and upper limb lymph nodes: Secondary | ICD-10-CM | POA: Diagnosis not present

## 2019-03-18 DIAGNOSIS — E878 Other disorders of electrolyte and fluid balance, not elsewhere classified: Secondary | ICD-10-CM | POA: Diagnosis not present

## 2019-03-18 DIAGNOSIS — T451X5A Adverse effect of antineoplastic and immunosuppressive drugs, initial encounter: Secondary | ICD-10-CM | POA: Diagnosis not present

## 2019-03-18 DIAGNOSIS — R197 Diarrhea, unspecified: Secondary | ICD-10-CM | POA: Diagnosis not present

## 2019-03-18 DIAGNOSIS — C5702 Malignant neoplasm of left fallopian tube: Secondary | ICD-10-CM | POA: Diagnosis not present

## 2019-03-18 DIAGNOSIS — R55 Syncope and collapse: Secondary | ICD-10-CM | POA: Diagnosis not present

## 2019-03-18 DIAGNOSIS — C50912 Malignant neoplasm of unspecified site of left female breast: Secondary | ICD-10-CM | POA: Diagnosis not present

## 2019-03-18 DIAGNOSIS — R110 Nausea: Secondary | ICD-10-CM | POA: Diagnosis not present

## 2019-03-18 DIAGNOSIS — R1111 Vomiting without nausea: Secondary | ICD-10-CM | POA: Diagnosis not present

## 2019-03-18 DIAGNOSIS — M6281 Muscle weakness (generalized): Secondary | ICD-10-CM | POA: Diagnosis not present

## 2019-03-18 DIAGNOSIS — D709 Neutropenia, unspecified: Secondary | ICD-10-CM | POA: Diagnosis not present

## 2019-03-18 DIAGNOSIS — R112 Nausea with vomiting, unspecified: Secondary | ICD-10-CM | POA: Diagnosis not present

## 2019-03-18 DIAGNOSIS — R531 Weakness: Secondary | ICD-10-CM | POA: Diagnosis not present

## 2019-03-18 DIAGNOSIS — K521 Toxic gastroenteritis and colitis: Secondary | ICD-10-CM | POA: Diagnosis not present

## 2019-03-18 DIAGNOSIS — C50919 Malignant neoplasm of unspecified site of unspecified female breast: Secondary | ICD-10-CM | POA: Diagnosis not present

## 2019-03-19 DIAGNOSIS — Z171 Estrogen receptor negative status [ER-]: Secondary | ICD-10-CM | POA: Diagnosis not present

## 2019-03-19 DIAGNOSIS — Z9049 Acquired absence of other specified parts of digestive tract: Secondary | ICD-10-CM | POA: Diagnosis not present

## 2019-03-19 DIAGNOSIS — K521 Toxic gastroenteritis and colitis: Secondary | ICD-10-CM | POA: Diagnosis present

## 2019-03-19 DIAGNOSIS — Z87891 Personal history of nicotine dependence: Secondary | ICD-10-CM | POA: Diagnosis not present

## 2019-03-19 DIAGNOSIS — R1110 Vomiting, unspecified: Secondary | ICD-10-CM | POA: Diagnosis not present

## 2019-03-19 DIAGNOSIS — D701 Agranulocytosis secondary to cancer chemotherapy: Secondary | ICD-10-CM | POA: Diagnosis present

## 2019-03-19 DIAGNOSIS — Z6834 Body mass index (BMI) 34.0-34.9, adult: Secondary | ICD-10-CM | POA: Diagnosis not present

## 2019-03-19 DIAGNOSIS — E785 Hyperlipidemia, unspecified: Secondary | ICD-10-CM | POA: Diagnosis present

## 2019-03-19 DIAGNOSIS — E875 Hyperkalemia: Secondary | ICD-10-CM | POA: Diagnosis not present

## 2019-03-19 DIAGNOSIS — T451X5A Adverse effect of antineoplastic and immunosuppressive drugs, initial encounter: Secondary | ICD-10-CM | POA: Diagnosis present

## 2019-03-19 DIAGNOSIS — Z90710 Acquired absence of both cervix and uterus: Secondary | ICD-10-CM | POA: Diagnosis not present

## 2019-03-19 DIAGNOSIS — D6481 Anemia due to antineoplastic chemotherapy: Secondary | ICD-10-CM | POA: Diagnosis present

## 2019-03-19 DIAGNOSIS — C50919 Malignant neoplasm of unspecified site of unspecified female breast: Secondary | ICD-10-CM | POA: Diagnosis not present

## 2019-03-19 DIAGNOSIS — E860 Dehydration: Secondary | ICD-10-CM | POA: Diagnosis present

## 2019-03-19 DIAGNOSIS — R55 Syncope and collapse: Secondary | ICD-10-CM | POA: Diagnosis not present

## 2019-03-19 DIAGNOSIS — E876 Hypokalemia: Secondary | ICD-10-CM | POA: Diagnosis present

## 2019-03-19 DIAGNOSIS — I10 Essential (primary) hypertension: Secondary | ICD-10-CM | POA: Diagnosis present

## 2019-03-19 DIAGNOSIS — Z86010 Personal history of colonic polyps: Secondary | ICD-10-CM | POA: Diagnosis not present

## 2019-03-19 DIAGNOSIS — E669 Obesity, unspecified: Secondary | ICD-10-CM | POA: Diagnosis present

## 2019-03-19 DIAGNOSIS — C5700 Malignant neoplasm of unspecified fallopian tube: Secondary | ICD-10-CM | POA: Diagnosis not present

## 2019-03-19 DIAGNOSIS — C50912 Malignant neoplasm of unspecified site of left female breast: Secondary | ICD-10-CM | POA: Diagnosis present

## 2019-03-19 DIAGNOSIS — C773 Secondary and unspecified malignant neoplasm of axilla and upper limb lymph nodes: Secondary | ICD-10-CM | POA: Diagnosis present

## 2019-03-19 DIAGNOSIS — D61818 Other pancytopenia: Secondary | ICD-10-CM | POA: Diagnosis not present

## 2019-03-19 DIAGNOSIS — C5701 Malignant neoplasm of right fallopian tube: Secondary | ICD-10-CM | POA: Diagnosis present

## 2019-03-19 DIAGNOSIS — D709 Neutropenia, unspecified: Secondary | ICD-10-CM | POA: Diagnosis not present

## 2019-03-19 DIAGNOSIS — R197 Diarrhea, unspecified: Secondary | ICD-10-CM | POA: Diagnosis not present

## 2019-03-27 DIAGNOSIS — C50812 Malignant neoplasm of overlapping sites of left female breast: Secondary | ICD-10-CM | POA: Diagnosis not present

## 2019-03-27 DIAGNOSIS — C5702 Malignant neoplasm of left fallopian tube: Secondary | ICD-10-CM | POA: Diagnosis not present

## 2019-03-27 DIAGNOSIS — K521 Toxic gastroenteritis and colitis: Secondary | ICD-10-CM | POA: Diagnosis not present

## 2019-03-27 DIAGNOSIS — R1900 Intra-abdominal and pelvic swelling, mass and lump, unspecified site: Secondary | ICD-10-CM | POA: Diagnosis not present

## 2019-03-27 DIAGNOSIS — Z171 Estrogen receptor negative status [ER-]: Secondary | ICD-10-CM | POA: Diagnosis not present

## 2019-03-27 DIAGNOSIS — T451X5A Adverse effect of antineoplastic and immunosuppressive drugs, initial encounter: Secondary | ICD-10-CM | POA: Diagnosis not present

## 2019-03-27 DIAGNOSIS — E860 Dehydration: Secondary | ICD-10-CM | POA: Diagnosis not present

## 2019-03-27 DIAGNOSIS — T50995S Adverse effect of other drugs, medicaments and biological substances, sequela: Secondary | ICD-10-CM | POA: Diagnosis not present

## 2019-03-27 DIAGNOSIS — Z09 Encounter for follow-up examination after completed treatment for conditions other than malignant neoplasm: Secondary | ICD-10-CM | POA: Diagnosis not present

## 2019-03-27 DIAGNOSIS — R197 Diarrhea, unspecified: Secondary | ICD-10-CM | POA: Diagnosis not present

## 2019-03-30 DIAGNOSIS — C50812 Malignant neoplasm of overlapping sites of left female breast: Secondary | ICD-10-CM | POA: Diagnosis not present

## 2019-03-30 DIAGNOSIS — C5702 Malignant neoplasm of left fallopian tube: Secondary | ICD-10-CM | POA: Diagnosis not present

## 2019-03-30 DIAGNOSIS — Z171 Estrogen receptor negative status [ER-]: Secondary | ICD-10-CM | POA: Diagnosis not present

## 2019-03-31 DIAGNOSIS — K521 Toxic gastroenteritis and colitis: Secondary | ICD-10-CM | POA: Diagnosis not present

## 2019-03-31 DIAGNOSIS — T451X5A Adverse effect of antineoplastic and immunosuppressive drugs, initial encounter: Secondary | ICD-10-CM | POA: Diagnosis not present

## 2019-04-01 DIAGNOSIS — K521 Toxic gastroenteritis and colitis: Secondary | ICD-10-CM | POA: Diagnosis not present

## 2019-04-01 DIAGNOSIS — C5702 Malignant neoplasm of left fallopian tube: Secondary | ICD-10-CM | POA: Diagnosis not present

## 2019-04-01 DIAGNOSIS — T451X5A Adverse effect of antineoplastic and immunosuppressive drugs, initial encounter: Secondary | ICD-10-CM | POA: Diagnosis not present

## 2019-04-09 DIAGNOSIS — T451X5A Adverse effect of antineoplastic and immunosuppressive drugs, initial encounter: Secondary | ICD-10-CM | POA: Diagnosis not present

## 2019-04-09 DIAGNOSIS — T451X5D Adverse effect of antineoplastic and immunosuppressive drugs, subsequent encounter: Secondary | ICD-10-CM | POA: Diagnosis not present

## 2019-04-09 DIAGNOSIS — C50812 Malignant neoplasm of overlapping sites of left female breast: Secondary | ICD-10-CM | POA: Diagnosis not present

## 2019-04-09 DIAGNOSIS — T50995S Adverse effect of other drugs, medicaments and biological substances, sequela: Secondary | ICD-10-CM | POA: Diagnosis not present

## 2019-04-09 DIAGNOSIS — Z171 Estrogen receptor negative status [ER-]: Secondary | ICD-10-CM | POA: Diagnosis not present

## 2019-04-09 DIAGNOSIS — K521 Toxic gastroenteritis and colitis: Secondary | ICD-10-CM | POA: Diagnosis not present

## 2019-04-09 DIAGNOSIS — C5702 Malignant neoplasm of left fallopian tube: Secondary | ICD-10-CM | POA: Diagnosis not present

## 2019-04-17 DIAGNOSIS — N289 Disorder of kidney and ureter, unspecified: Secondary | ICD-10-CM | POA: Diagnosis not present

## 2019-04-17 DIAGNOSIS — Z9049 Acquired absence of other specified parts of digestive tract: Secondary | ICD-10-CM | POA: Diagnosis not present

## 2019-04-17 DIAGNOSIS — C50812 Malignant neoplasm of overlapping sites of left female breast: Secondary | ICD-10-CM | POA: Diagnosis not present

## 2019-04-17 DIAGNOSIS — Z171 Estrogen receptor negative status [ER-]: Secondary | ICD-10-CM | POA: Diagnosis not present

## 2019-04-17 DIAGNOSIS — M5137 Other intervertebral disc degeneration, lumbosacral region: Secondary | ICD-10-CM | POA: Diagnosis not present

## 2019-04-17 DIAGNOSIS — N2889 Other specified disorders of kidney and ureter: Secondary | ICD-10-CM | POA: Diagnosis not present

## 2019-04-17 DIAGNOSIS — M47817 Spondylosis without myelopathy or radiculopathy, lumbosacral region: Secondary | ICD-10-CM | POA: Diagnosis not present

## 2019-04-17 DIAGNOSIS — I700 Atherosclerosis of aorta: Secondary | ICD-10-CM | POA: Diagnosis not present

## 2019-04-17 DIAGNOSIS — K5900 Constipation, unspecified: Secondary | ICD-10-CM | POA: Diagnosis not present

## 2019-04-17 DIAGNOSIS — C5702 Malignant neoplasm of left fallopian tube: Secondary | ICD-10-CM | POA: Diagnosis not present

## 2019-04-17 DIAGNOSIS — N200 Calculus of kidney: Secondary | ICD-10-CM | POA: Diagnosis not present

## 2019-04-17 DIAGNOSIS — K838 Other specified diseases of biliary tract: Secondary | ICD-10-CM | POA: Diagnosis not present

## 2019-04-17 DIAGNOSIS — I2510 Atherosclerotic heart disease of native coronary artery without angina pectoris: Secondary | ICD-10-CM | POA: Diagnosis not present

## 2019-04-17 DIAGNOSIS — K5730 Diverticulosis of large intestine without perforation or abscess without bleeding: Secondary | ICD-10-CM | POA: Diagnosis not present

## 2019-04-17 DIAGNOSIS — C5700 Malignant neoplasm of unspecified fallopian tube: Secondary | ICD-10-CM | POA: Diagnosis not present

## 2019-04-23 DIAGNOSIS — N281 Cyst of kidney, acquired: Secondary | ICD-10-CM | POA: Diagnosis not present

## 2019-04-23 DIAGNOSIS — C50912 Malignant neoplasm of unspecified site of left female breast: Secondary | ICD-10-CM | POA: Diagnosis not present

## 2019-04-29 DIAGNOSIS — Z90710 Acquired absence of both cervix and uterus: Secondary | ICD-10-CM | POA: Diagnosis not present

## 2019-04-29 DIAGNOSIS — C569 Malignant neoplasm of unspecified ovary: Secondary | ICD-10-CM | POA: Diagnosis not present

## 2019-04-29 DIAGNOSIS — Z171 Estrogen receptor negative status [ER-]: Secondary | ICD-10-CM | POA: Diagnosis not present

## 2019-04-29 DIAGNOSIS — E785 Hyperlipidemia, unspecified: Secondary | ICD-10-CM | POA: Diagnosis not present

## 2019-04-29 DIAGNOSIS — Z90722 Acquired absence of ovaries, bilateral: Secondary | ICD-10-CM | POA: Diagnosis not present

## 2019-04-29 DIAGNOSIS — F17210 Nicotine dependence, cigarettes, uncomplicated: Secondary | ICD-10-CM | POA: Diagnosis not present

## 2019-04-29 DIAGNOSIS — C5702 Malignant neoplasm of left fallopian tube: Secondary | ICD-10-CM | POA: Diagnosis not present

## 2019-04-29 DIAGNOSIS — I10 Essential (primary) hypertension: Secondary | ICD-10-CM | POA: Diagnosis not present

## 2019-04-29 DIAGNOSIS — C50812 Malignant neoplasm of overlapping sites of left female breast: Secondary | ICD-10-CM | POA: Diagnosis not present

## 2019-04-29 DIAGNOSIS — Z79899 Other long term (current) drug therapy: Secondary | ICD-10-CM | POA: Diagnosis not present

## 2019-04-29 DIAGNOSIS — Z9221 Personal history of antineoplastic chemotherapy: Secondary | ICD-10-CM | POA: Diagnosis not present

## 2019-04-29 DIAGNOSIS — C50412 Malignant neoplasm of upper-outer quadrant of left female breast: Secondary | ICD-10-CM | POA: Diagnosis not present

## 2019-05-01 DIAGNOSIS — C50812 Malignant neoplasm of overlapping sites of left female breast: Secondary | ICD-10-CM | POA: Diagnosis not present

## 2019-05-01 DIAGNOSIS — C5702 Malignant neoplasm of left fallopian tube: Secondary | ICD-10-CM | POA: Diagnosis not present

## 2019-05-01 DIAGNOSIS — T50995S Adverse effect of other drugs, medicaments and biological substances, sequela: Secondary | ICD-10-CM | POA: Diagnosis not present

## 2019-05-01 DIAGNOSIS — Z171 Estrogen receptor negative status [ER-]: Secondary | ICD-10-CM | POA: Diagnosis not present

## 2019-05-01 DIAGNOSIS — Z09 Encounter for follow-up examination after completed treatment for conditions other than malignant neoplasm: Secondary | ICD-10-CM | POA: Diagnosis not present

## 2019-05-04 DIAGNOSIS — C5702 Malignant neoplasm of left fallopian tube: Secondary | ICD-10-CM | POA: Diagnosis not present

## 2019-05-16 DIAGNOSIS — Z20828 Contact with and (suspected) exposure to other viral communicable diseases: Secondary | ICD-10-CM | POA: Diagnosis not present

## 2019-05-16 DIAGNOSIS — Z01812 Encounter for preprocedural laboratory examination: Secondary | ICD-10-CM | POA: Diagnosis not present

## 2019-05-19 DIAGNOSIS — Z171 Estrogen receptor negative status [ER-]: Secondary | ICD-10-CM | POA: Diagnosis not present

## 2019-05-19 DIAGNOSIS — Z87891 Personal history of nicotine dependence: Secondary | ICD-10-CM | POA: Diagnosis not present

## 2019-05-19 DIAGNOSIS — C773 Secondary and unspecified malignant neoplasm of axilla and upper limb lymph nodes: Secondary | ICD-10-CM | POA: Diagnosis not present

## 2019-05-19 DIAGNOSIS — Z9221 Personal history of antineoplastic chemotherapy: Secondary | ICD-10-CM | POA: Diagnosis not present

## 2019-05-19 DIAGNOSIS — E785 Hyperlipidemia, unspecified: Secondary | ICD-10-CM | POA: Diagnosis not present

## 2019-05-19 DIAGNOSIS — Z803 Family history of malignant neoplasm of breast: Secondary | ICD-10-CM | POA: Diagnosis not present

## 2019-05-19 DIAGNOSIS — C50912 Malignant neoplasm of unspecified site of left female breast: Secondary | ICD-10-CM | POA: Diagnosis not present

## 2019-05-19 DIAGNOSIS — Z8041 Family history of malignant neoplasm of ovary: Secondary | ICD-10-CM | POA: Diagnosis not present

## 2019-05-19 DIAGNOSIS — C5702 Malignant neoplasm of left fallopian tube: Secondary | ICD-10-CM | POA: Diagnosis not present

## 2019-05-19 DIAGNOSIS — C561 Malignant neoplasm of right ovary: Secondary | ICD-10-CM | POA: Diagnosis not present

## 2019-05-19 DIAGNOSIS — N840 Polyp of corpus uteri: Secondary | ICD-10-CM | POA: Diagnosis not present

## 2019-05-19 DIAGNOSIS — I10 Essential (primary) hypertension: Secondary | ICD-10-CM | POA: Diagnosis not present

## 2019-05-19 DIAGNOSIS — C50412 Malignant neoplasm of upper-outer quadrant of left female breast: Secondary | ICD-10-CM | POA: Diagnosis not present

## 2019-05-19 DIAGNOSIS — Z9049 Acquired absence of other specified parts of digestive tract: Secondary | ICD-10-CM | POA: Diagnosis not present

## 2019-05-19 DIAGNOSIS — C482 Malignant neoplasm of peritoneum, unspecified: Secondary | ICD-10-CM | POA: Diagnosis not present

## 2019-05-19 DIAGNOSIS — N800 Endometriosis of uterus: Secondary | ICD-10-CM | POA: Diagnosis not present

## 2019-05-19 DIAGNOSIS — Z90710 Acquired absence of both cervix and uterus: Secondary | ICD-10-CM | POA: Diagnosis not present

## 2019-05-20 DIAGNOSIS — C773 Secondary and unspecified malignant neoplasm of axilla and upper limb lymph nodes: Secondary | ICD-10-CM | POA: Diagnosis not present

## 2019-05-20 DIAGNOSIS — C50912 Malignant neoplasm of unspecified site of left female breast: Secondary | ICD-10-CM | POA: Diagnosis not present

## 2019-05-20 DIAGNOSIS — N840 Polyp of corpus uteri: Secondary | ICD-10-CM | POA: Diagnosis not present

## 2019-05-20 DIAGNOSIS — C482 Malignant neoplasm of peritoneum, unspecified: Secondary | ICD-10-CM | POA: Diagnosis not present

## 2019-05-20 DIAGNOSIS — C5702 Malignant neoplasm of left fallopian tube: Secondary | ICD-10-CM | POA: Diagnosis not present

## 2019-05-20 DIAGNOSIS — C561 Malignant neoplasm of right ovary: Secondary | ICD-10-CM | POA: Diagnosis not present

## 2019-06-01 DIAGNOSIS — Z171 Estrogen receptor negative status [ER-]: Secondary | ICD-10-CM | POA: Diagnosis not present

## 2019-06-01 DIAGNOSIS — Z09 Encounter for follow-up examination after completed treatment for conditions other than malignant neoplasm: Secondary | ICD-10-CM | POA: Diagnosis not present

## 2019-06-01 DIAGNOSIS — C50812 Malignant neoplasm of overlapping sites of left female breast: Secondary | ICD-10-CM | POA: Diagnosis not present

## 2019-06-02 DIAGNOSIS — C50812 Malignant neoplasm of overlapping sites of left female breast: Secondary | ICD-10-CM | POA: Diagnosis not present

## 2019-06-02 DIAGNOSIS — Z171 Estrogen receptor negative status [ER-]: Secondary | ICD-10-CM | POA: Diagnosis not present

## 2019-06-02 DIAGNOSIS — C5702 Malignant neoplasm of left fallopian tube: Secondary | ICD-10-CM | POA: Diagnosis not present

## 2019-06-08 DIAGNOSIS — C50812 Malignant neoplasm of overlapping sites of left female breast: Secondary | ICD-10-CM | POA: Diagnosis not present

## 2019-06-08 DIAGNOSIS — Z171 Estrogen receptor negative status [ER-]: Secondary | ICD-10-CM | POA: Diagnosis not present

## 2019-06-08 DIAGNOSIS — Z09 Encounter for follow-up examination after completed treatment for conditions other than malignant neoplasm: Secondary | ICD-10-CM | POA: Diagnosis not present

## 2019-06-09 DIAGNOSIS — Z171 Estrogen receptor negative status [ER-]: Secondary | ICD-10-CM | POA: Diagnosis not present

## 2019-06-09 DIAGNOSIS — C50812 Malignant neoplasm of overlapping sites of left female breast: Secondary | ICD-10-CM | POA: Diagnosis not present

## 2019-06-15 DIAGNOSIS — Z452 Encounter for adjustment and management of vascular access device: Secondary | ICD-10-CM | POA: Diagnosis not present

## 2019-06-15 DIAGNOSIS — C5702 Malignant neoplasm of left fallopian tube: Secondary | ICD-10-CM | POA: Diagnosis not present

## 2019-06-16 DIAGNOSIS — C5702 Malignant neoplasm of left fallopian tube: Secondary | ICD-10-CM | POA: Diagnosis not present

## 2019-06-22 DIAGNOSIS — I890 Lymphedema, not elsewhere classified: Secondary | ICD-10-CM | POA: Diagnosis not present

## 2019-06-23 DIAGNOSIS — C50812 Malignant neoplasm of overlapping sites of left female breast: Secondary | ICD-10-CM | POA: Diagnosis not present

## 2019-06-23 DIAGNOSIS — Z171 Estrogen receptor negative status [ER-]: Secondary | ICD-10-CM | POA: Diagnosis not present

## 2019-06-24 DIAGNOSIS — Z171 Estrogen receptor negative status [ER-]: Secondary | ICD-10-CM | POA: Diagnosis not present

## 2019-06-24 DIAGNOSIS — C50812 Malignant neoplasm of overlapping sites of left female breast: Secondary | ICD-10-CM | POA: Diagnosis not present

## 2019-07-06 DIAGNOSIS — Z09 Encounter for follow-up examination after completed treatment for conditions other than malignant neoplasm: Secondary | ICD-10-CM | POA: Diagnosis not present

## 2019-07-06 DIAGNOSIS — T451X5A Adverse effect of antineoplastic and immunosuppressive drugs, initial encounter: Secondary | ICD-10-CM | POA: Diagnosis not present

## 2019-07-06 DIAGNOSIS — C5702 Malignant neoplasm of left fallopian tube: Secondary | ICD-10-CM | POA: Diagnosis not present

## 2019-07-06 DIAGNOSIS — C50812 Malignant neoplasm of overlapping sites of left female breast: Secondary | ICD-10-CM | POA: Diagnosis not present

## 2019-07-06 DIAGNOSIS — K521 Toxic gastroenteritis and colitis: Secondary | ICD-10-CM | POA: Diagnosis not present

## 2019-07-06 DIAGNOSIS — Z171 Estrogen receptor negative status [ER-]: Secondary | ICD-10-CM | POA: Diagnosis not present

## 2019-07-06 DIAGNOSIS — R197 Diarrhea, unspecified: Secondary | ICD-10-CM | POA: Diagnosis not present

## 2019-07-09 DIAGNOSIS — S301XXA Contusion of abdominal wall, initial encounter: Secondary | ICD-10-CM | POA: Insufficient documentation

## 2019-07-09 DIAGNOSIS — C50812 Malignant neoplasm of overlapping sites of left female breast: Secondary | ICD-10-CM | POA: Diagnosis not present

## 2019-07-09 DIAGNOSIS — F331 Major depressive disorder, recurrent, moderate: Secondary | ICD-10-CM | POA: Diagnosis not present

## 2019-07-09 DIAGNOSIS — Z171 Estrogen receptor negative status [ER-]: Secondary | ICD-10-CM | POA: Diagnosis not present

## 2019-07-16 DIAGNOSIS — Z171 Estrogen receptor negative status [ER-]: Secondary | ICD-10-CM | POA: Diagnosis not present

## 2019-07-16 DIAGNOSIS — C50812 Malignant neoplasm of overlapping sites of left female breast: Secondary | ICD-10-CM | POA: Diagnosis not present

## 2019-07-16 DIAGNOSIS — R1902 Left upper quadrant abdominal swelling, mass and lump: Secondary | ICD-10-CM | POA: Diagnosis not present

## 2019-07-20 DIAGNOSIS — C50812 Malignant neoplasm of overlapping sites of left female breast: Secondary | ICD-10-CM | POA: Diagnosis not present

## 2019-08-10 DIAGNOSIS — C50812 Malignant neoplasm of overlapping sites of left female breast: Secondary | ICD-10-CM | POA: Diagnosis not present

## 2019-08-10 DIAGNOSIS — Z171 Estrogen receptor negative status [ER-]: Secondary | ICD-10-CM | POA: Diagnosis not present

## 2019-08-10 DIAGNOSIS — C5702 Malignant neoplasm of left fallopian tube: Secondary | ICD-10-CM | POA: Diagnosis not present

## 2019-08-11 DIAGNOSIS — C5702 Malignant neoplasm of left fallopian tube: Secondary | ICD-10-CM | POA: Diagnosis not present

## 2019-08-11 DIAGNOSIS — C50812 Malignant neoplasm of overlapping sites of left female breast: Secondary | ICD-10-CM | POA: Diagnosis not present

## 2019-08-11 DIAGNOSIS — Z171 Estrogen receptor negative status [ER-]: Secondary | ICD-10-CM | POA: Diagnosis not present

## 2019-08-12 DIAGNOSIS — Z171 Estrogen receptor negative status [ER-]: Secondary | ICD-10-CM | POA: Diagnosis not present

## 2019-08-12 DIAGNOSIS — C50812 Malignant neoplasm of overlapping sites of left female breast: Secondary | ICD-10-CM | POA: Diagnosis not present

## 2019-08-12 DIAGNOSIS — C5702 Malignant neoplasm of left fallopian tube: Secondary | ICD-10-CM | POA: Diagnosis not present

## 2019-08-13 DIAGNOSIS — C50812 Malignant neoplasm of overlapping sites of left female breast: Secondary | ICD-10-CM | POA: Diagnosis not present

## 2019-08-13 DIAGNOSIS — Z171 Estrogen receptor negative status [ER-]: Secondary | ICD-10-CM | POA: Diagnosis not present

## 2019-08-13 DIAGNOSIS — C5702 Malignant neoplasm of left fallopian tube: Secondary | ICD-10-CM | POA: Diagnosis not present

## 2019-08-14 DIAGNOSIS — Z171 Estrogen receptor negative status [ER-]: Secondary | ICD-10-CM | POA: Diagnosis not present

## 2019-08-14 DIAGNOSIS — C50812 Malignant neoplasm of overlapping sites of left female breast: Secondary | ICD-10-CM | POA: Diagnosis not present

## 2019-08-14 DIAGNOSIS — C5702 Malignant neoplasm of left fallopian tube: Secondary | ICD-10-CM | POA: Diagnosis not present

## 2019-08-17 DIAGNOSIS — C50812 Malignant neoplasm of overlapping sites of left female breast: Secondary | ICD-10-CM | POA: Diagnosis not present

## 2019-08-17 DIAGNOSIS — Z171 Estrogen receptor negative status [ER-]: Secondary | ICD-10-CM | POA: Diagnosis not present

## 2019-08-17 DIAGNOSIS — C5702 Malignant neoplasm of left fallopian tube: Secondary | ICD-10-CM | POA: Diagnosis not present

## 2019-08-18 DIAGNOSIS — C5702 Malignant neoplasm of left fallopian tube: Secondary | ICD-10-CM | POA: Diagnosis not present

## 2019-08-18 DIAGNOSIS — Z171 Estrogen receptor negative status [ER-]: Secondary | ICD-10-CM | POA: Diagnosis not present

## 2019-08-18 DIAGNOSIS — C50812 Malignant neoplasm of overlapping sites of left female breast: Secondary | ICD-10-CM | POA: Diagnosis not present

## 2019-08-19 DIAGNOSIS — Z171 Estrogen receptor negative status [ER-]: Secondary | ICD-10-CM | POA: Diagnosis not present

## 2019-08-19 DIAGNOSIS — C50812 Malignant neoplasm of overlapping sites of left female breast: Secondary | ICD-10-CM | POA: Diagnosis not present

## 2019-08-19 DIAGNOSIS — C5702 Malignant neoplasm of left fallopian tube: Secondary | ICD-10-CM | POA: Diagnosis not present

## 2019-08-20 DIAGNOSIS — C5702 Malignant neoplasm of left fallopian tube: Secondary | ICD-10-CM | POA: Diagnosis not present

## 2019-08-20 DIAGNOSIS — C50812 Malignant neoplasm of overlapping sites of left female breast: Secondary | ICD-10-CM | POA: Diagnosis not present

## 2019-08-20 DIAGNOSIS — Z171 Estrogen receptor negative status [ER-]: Secondary | ICD-10-CM | POA: Diagnosis not present

## 2019-08-21 DIAGNOSIS — Z171 Estrogen receptor negative status [ER-]: Secondary | ICD-10-CM | POA: Diagnosis not present

## 2019-08-21 DIAGNOSIS — C5702 Malignant neoplasm of left fallopian tube: Secondary | ICD-10-CM | POA: Diagnosis not present

## 2019-08-21 DIAGNOSIS — C50812 Malignant neoplasm of overlapping sites of left female breast: Secondary | ICD-10-CM | POA: Diagnosis not present

## 2019-08-25 DIAGNOSIS — C5702 Malignant neoplasm of left fallopian tube: Secondary | ICD-10-CM | POA: Diagnosis not present

## 2019-08-25 DIAGNOSIS — Z171 Estrogen receptor negative status [ER-]: Secondary | ICD-10-CM | POA: Diagnosis not present

## 2019-08-25 DIAGNOSIS — C50812 Malignant neoplasm of overlapping sites of left female breast: Secondary | ICD-10-CM | POA: Diagnosis not present

## 2019-08-26 DIAGNOSIS — C5702 Malignant neoplasm of left fallopian tube: Secondary | ICD-10-CM | POA: Diagnosis not present

## 2019-08-26 DIAGNOSIS — Z171 Estrogen receptor negative status [ER-]: Secondary | ICD-10-CM | POA: Diagnosis not present

## 2019-08-26 DIAGNOSIS — C50812 Malignant neoplasm of overlapping sites of left female breast: Secondary | ICD-10-CM | POA: Diagnosis not present

## 2019-08-27 DIAGNOSIS — Z171 Estrogen receptor negative status [ER-]: Secondary | ICD-10-CM | POA: Diagnosis not present

## 2019-08-27 DIAGNOSIS — C5702 Malignant neoplasm of left fallopian tube: Secondary | ICD-10-CM | POA: Diagnosis not present

## 2019-08-27 DIAGNOSIS — C50812 Malignant neoplasm of overlapping sites of left female breast: Secondary | ICD-10-CM | POA: Diagnosis not present

## 2019-08-27 DIAGNOSIS — Z23 Encounter for immunization: Secondary | ICD-10-CM | POA: Diagnosis not present

## 2019-08-28 DIAGNOSIS — C50812 Malignant neoplasm of overlapping sites of left female breast: Secondary | ICD-10-CM | POA: Diagnosis not present

## 2019-08-28 DIAGNOSIS — C5702 Malignant neoplasm of left fallopian tube: Secondary | ICD-10-CM | POA: Diagnosis not present

## 2019-08-28 DIAGNOSIS — Z171 Estrogen receptor negative status [ER-]: Secondary | ICD-10-CM | POA: Diagnosis not present

## 2019-08-31 DIAGNOSIS — C50812 Malignant neoplasm of overlapping sites of left female breast: Secondary | ICD-10-CM | POA: Diagnosis not present

## 2019-08-31 DIAGNOSIS — Z171 Estrogen receptor negative status [ER-]: Secondary | ICD-10-CM | POA: Diagnosis not present

## 2019-08-31 DIAGNOSIS — C5702 Malignant neoplasm of left fallopian tube: Secondary | ICD-10-CM | POA: Diagnosis not present

## 2019-09-01 DIAGNOSIS — Z171 Estrogen receptor negative status [ER-]: Secondary | ICD-10-CM | POA: Diagnosis not present

## 2019-09-01 DIAGNOSIS — C5702 Malignant neoplasm of left fallopian tube: Secondary | ICD-10-CM | POA: Diagnosis not present

## 2019-09-01 DIAGNOSIS — C50812 Malignant neoplasm of overlapping sites of left female breast: Secondary | ICD-10-CM | POA: Diagnosis not present

## 2019-09-02 DIAGNOSIS — C5702 Malignant neoplasm of left fallopian tube: Secondary | ICD-10-CM | POA: Diagnosis not present

## 2019-09-02 DIAGNOSIS — C50812 Malignant neoplasm of overlapping sites of left female breast: Secondary | ICD-10-CM | POA: Diagnosis not present

## 2019-09-02 DIAGNOSIS — Z171 Estrogen receptor negative status [ER-]: Secondary | ICD-10-CM | POA: Diagnosis not present

## 2019-09-03 DIAGNOSIS — C5702 Malignant neoplasm of left fallopian tube: Secondary | ICD-10-CM | POA: Diagnosis not present

## 2019-09-03 DIAGNOSIS — C50812 Malignant neoplasm of overlapping sites of left female breast: Secondary | ICD-10-CM | POA: Diagnosis not present

## 2019-09-03 DIAGNOSIS — Z171 Estrogen receptor negative status [ER-]: Secondary | ICD-10-CM | POA: Diagnosis not present

## 2019-09-04 DIAGNOSIS — Z171 Estrogen receptor negative status [ER-]: Secondary | ICD-10-CM | POA: Diagnosis not present

## 2019-09-04 DIAGNOSIS — C50812 Malignant neoplasm of overlapping sites of left female breast: Secondary | ICD-10-CM | POA: Diagnosis not present

## 2019-09-04 DIAGNOSIS — C5702 Malignant neoplasm of left fallopian tube: Secondary | ICD-10-CM | POA: Diagnosis not present

## 2019-09-07 DIAGNOSIS — Z171 Estrogen receptor negative status [ER-]: Secondary | ICD-10-CM | POA: Diagnosis not present

## 2019-09-07 DIAGNOSIS — C50812 Malignant neoplasm of overlapping sites of left female breast: Secondary | ICD-10-CM | POA: Diagnosis not present

## 2019-09-08 DIAGNOSIS — C50812 Malignant neoplasm of overlapping sites of left female breast: Secondary | ICD-10-CM | POA: Diagnosis not present

## 2019-09-08 DIAGNOSIS — Z171 Estrogen receptor negative status [ER-]: Secondary | ICD-10-CM | POA: Diagnosis not present

## 2019-09-09 DIAGNOSIS — C50812 Malignant neoplasm of overlapping sites of left female breast: Secondary | ICD-10-CM | POA: Diagnosis not present

## 2019-09-09 DIAGNOSIS — Z171 Estrogen receptor negative status [ER-]: Secondary | ICD-10-CM | POA: Diagnosis not present

## 2019-09-10 DIAGNOSIS — C50812 Malignant neoplasm of overlapping sites of left female breast: Secondary | ICD-10-CM | POA: Diagnosis not present

## 2019-09-10 DIAGNOSIS — Z171 Estrogen receptor negative status [ER-]: Secondary | ICD-10-CM | POA: Diagnosis not present

## 2019-09-11 DIAGNOSIS — Z171 Estrogen receptor negative status [ER-]: Secondary | ICD-10-CM | POA: Diagnosis not present

## 2019-09-11 DIAGNOSIS — C50812 Malignant neoplasm of overlapping sites of left female breast: Secondary | ICD-10-CM | POA: Diagnosis not present

## 2019-09-14 DIAGNOSIS — C50812 Malignant neoplasm of overlapping sites of left female breast: Secondary | ICD-10-CM | POA: Diagnosis not present

## 2019-09-14 DIAGNOSIS — L589 Radiodermatitis, unspecified: Secondary | ICD-10-CM | POA: Insufficient documentation

## 2019-09-14 DIAGNOSIS — Z171 Estrogen receptor negative status [ER-]: Secondary | ICD-10-CM | POA: Diagnosis not present

## 2019-09-15 DIAGNOSIS — C50812 Malignant neoplasm of overlapping sites of left female breast: Secondary | ICD-10-CM | POA: Diagnosis not present

## 2019-09-15 DIAGNOSIS — Z171 Estrogen receptor negative status [ER-]: Secondary | ICD-10-CM | POA: Diagnosis not present

## 2019-09-23 DIAGNOSIS — Z452 Encounter for adjustment and management of vascular access device: Secondary | ICD-10-CM | POA: Diagnosis not present

## 2019-09-23 DIAGNOSIS — C5702 Malignant neoplasm of left fallopian tube: Secondary | ICD-10-CM | POA: Diagnosis not present

## 2019-09-23 DIAGNOSIS — C50812 Malignant neoplasm of overlapping sites of left female breast: Secondary | ICD-10-CM | POA: Diagnosis not present

## 2019-09-23 DIAGNOSIS — Z171 Estrogen receptor negative status [ER-]: Secondary | ICD-10-CM | POA: Diagnosis not present

## 2019-10-02 DIAGNOSIS — Z23 Encounter for immunization: Secondary | ICD-10-CM | POA: Diagnosis not present

## 2019-10-14 DIAGNOSIS — C50812 Malignant neoplasm of overlapping sites of left female breast: Secondary | ICD-10-CM | POA: Diagnosis not present

## 2019-10-14 DIAGNOSIS — S301XXD Contusion of abdominal wall, subsequent encounter: Secondary | ICD-10-CM | POA: Diagnosis not present

## 2019-10-14 DIAGNOSIS — K439 Ventral hernia without obstruction or gangrene: Secondary | ICD-10-CM | POA: Diagnosis not present

## 2019-10-14 DIAGNOSIS — I890 Lymphedema, not elsewhere classified: Secondary | ICD-10-CM | POA: Diagnosis not present

## 2019-10-14 DIAGNOSIS — C5702 Malignant neoplasm of left fallopian tube: Secondary | ICD-10-CM | POA: Diagnosis not present

## 2019-10-14 DIAGNOSIS — Z171 Estrogen receptor negative status [ER-]: Secondary | ICD-10-CM | POA: Diagnosis not present

## 2019-10-19 DIAGNOSIS — K439 Ventral hernia without obstruction or gangrene: Secondary | ICD-10-CM | POA: Insufficient documentation

## 2019-10-30 DIAGNOSIS — Z9012 Acquired absence of left breast and nipple: Secondary | ICD-10-CM | POA: Diagnosis not present

## 2019-10-30 DIAGNOSIS — I700 Atherosclerosis of aorta: Secondary | ICD-10-CM | POA: Diagnosis not present

## 2019-10-30 DIAGNOSIS — Z171 Estrogen receptor negative status [ER-]: Secondary | ICD-10-CM | POA: Diagnosis not present

## 2019-10-30 DIAGNOSIS — K429 Umbilical hernia without obstruction or gangrene: Secondary | ICD-10-CM | POA: Diagnosis not present

## 2019-10-30 DIAGNOSIS — C5702 Malignant neoplasm of left fallopian tube: Secondary | ICD-10-CM | POA: Diagnosis not present

## 2019-10-30 DIAGNOSIS — C50812 Malignant neoplasm of overlapping sites of left female breast: Secondary | ICD-10-CM | POA: Diagnosis not present

## 2019-11-05 DIAGNOSIS — C5702 Malignant neoplasm of left fallopian tube: Secondary | ICD-10-CM | POA: Diagnosis not present

## 2019-11-05 DIAGNOSIS — Z171 Estrogen receptor negative status [ER-]: Secondary | ICD-10-CM | POA: Diagnosis not present

## 2019-11-05 DIAGNOSIS — K439 Ventral hernia without obstruction or gangrene: Secondary | ICD-10-CM | POA: Diagnosis not present

## 2019-11-05 DIAGNOSIS — C50812 Malignant neoplasm of overlapping sites of left female breast: Secondary | ICD-10-CM | POA: Diagnosis not present

## 2019-11-05 DIAGNOSIS — S301XXD Contusion of abdominal wall, subsequent encounter: Secondary | ICD-10-CM | POA: Diagnosis not present

## 2019-12-07 DIAGNOSIS — C50812 Malignant neoplasm of overlapping sites of left female breast: Secondary | ICD-10-CM | POA: Diagnosis not present

## 2019-12-07 DIAGNOSIS — S301XXD Contusion of abdominal wall, subsequent encounter: Secondary | ICD-10-CM | POA: Diagnosis not present

## 2019-12-07 DIAGNOSIS — Z171 Estrogen receptor negative status [ER-]: Secondary | ICD-10-CM | POA: Diagnosis not present

## 2019-12-14 DIAGNOSIS — S301XXD Contusion of abdominal wall, subsequent encounter: Secondary | ICD-10-CM | POA: Diagnosis not present

## 2019-12-15 DIAGNOSIS — Z09 Encounter for follow-up examination after completed treatment for conditions other than malignant neoplasm: Secondary | ICD-10-CM | POA: Diagnosis not present

## 2019-12-15 DIAGNOSIS — C5702 Malignant neoplasm of left fallopian tube: Secondary | ICD-10-CM | POA: Diagnosis not present

## 2019-12-28 DIAGNOSIS — C5702 Malignant neoplasm of left fallopian tube: Secondary | ICD-10-CM | POA: Diagnosis not present

## 2019-12-28 DIAGNOSIS — K439 Ventral hernia without obstruction or gangrene: Secondary | ICD-10-CM | POA: Diagnosis not present

## 2019-12-28 DIAGNOSIS — Z171 Estrogen receptor negative status [ER-]: Secondary | ICD-10-CM | POA: Diagnosis not present

## 2019-12-28 DIAGNOSIS — C50812 Malignant neoplasm of overlapping sites of left female breast: Secondary | ICD-10-CM | POA: Diagnosis not present

## 2020-01-20 DIAGNOSIS — C50812 Malignant neoplasm of overlapping sites of left female breast: Secondary | ICD-10-CM | POA: Diagnosis not present

## 2020-01-20 DIAGNOSIS — Z171 Estrogen receptor negative status [ER-]: Secondary | ICD-10-CM | POA: Diagnosis not present

## 2020-01-20 DIAGNOSIS — C5702 Malignant neoplasm of left fallopian tube: Secondary | ICD-10-CM | POA: Diagnosis not present

## 2020-02-01 DIAGNOSIS — C569 Malignant neoplasm of unspecified ovary: Secondary | ICD-10-CM | POA: Diagnosis not present

## 2020-02-01 DIAGNOSIS — Z8543 Personal history of malignant neoplasm of ovary: Secondary | ICD-10-CM | POA: Diagnosis not present

## 2020-02-01 DIAGNOSIS — C50919 Malignant neoplasm of unspecified site of unspecified female breast: Secondary | ICD-10-CM | POA: Diagnosis not present

## 2020-02-01 DIAGNOSIS — Z853 Personal history of malignant neoplasm of breast: Secondary | ICD-10-CM | POA: Diagnosis not present

## 2020-02-04 ENCOUNTER — Other Ambulatory Visit: Payer: Self-pay

## 2020-02-04 ENCOUNTER — Encounter: Payer: Self-pay | Admitting: Orthopaedic Surgery

## 2020-02-04 ENCOUNTER — Ambulatory Visit (INDEPENDENT_AMBULATORY_CARE_PROVIDER_SITE_OTHER): Payer: Medicare Other | Admitting: Orthopaedic Surgery

## 2020-02-04 VITALS — BP 123/79 | HR 109 | Ht 65.0 in | Wt 229.0 lb

## 2020-02-04 DIAGNOSIS — M71122 Other infective bursitis, left elbow: Secondary | ICD-10-CM

## 2020-02-04 LAB — SYNOVIAL CELL COUNT + DIFF, W/ CRYSTALS
Basophils, %: 0 %
Eosinophils-Synovial: 0 % (ref 0–2)
Lymphocytes-Synovial Fld: 6 % (ref 0–74)
Monocyte/Macrophage: 2 % (ref 0–69)
Neutrophil, Synovial: 92 % — ABNORMAL HIGH (ref 0–24)
Synoviocytes, %: 0 % (ref 0–15)
WBC, Synovial: 9464 cells/uL — ABNORMAL HIGH (ref <150)

## 2020-02-04 NOTE — Progress Notes (Signed)
Subjective:    Patient ID: Sierra Roberts, female    DOB: 07-Apr-1951, 69 y.o.   MRN: [id number]  HPI She has infection of the left olecranon area.  She has been followed by [facility]  She was placed on Septra DS on June 24th.  Her redness is less and her swelling is less but she still has redness and swelling.  Her pain is almost gone.  I have read the notes from [facility].  She denies trauma.  She says she "picked at the skin" on the elbow about a week before it started bothering her.  She has no fever or chills.  She has no other joint problem. .   Review of Systems  Constitutional: Positive for activity change.  Musculoskeletal: Positive for arthralgias and joint swelling.  All other systems reviewed and are negative.  For Review of Systems, all other systems reviewed and are negative.  The following is a summary of the past history medically, past history surgically, known current medicines, social history and family history.  This information is gathered electronically by the computer from prior information and documentation.  I review this each visit and have found including this information at this point in the chart is beneficial and informative.   Past Medical History:  Diagnosis Date  . C. difficile diarrhea   . Cancer [facility])    left breast cancer  . High cholesterol   . Hypertension     Past Surgical History:  Procedure Laterality Date  . ABDOMINAL HYSTERECTOMY    . CHOLECYSTECTOMY    . COLONOSCOPY N/A 12/30/2012   Procedure: COLONOSCOPY;  Surgeon: Daneil Dolin, MD;  Location: [facility] SUITE;  Service: Endoscopy;  Laterality: N/A;  8:30-rescheduled to 9:30 Darius Bump notified pt  . DILATION AND CURETTAGE OF UTERUS     to remove IUD    Current Outpatient Medications on File Prior to Visit  Medication Sig Dispense Refill  . sulfamethoxazole-trimethoprim (BACTRIM DS) 800-160 MG tablet Take 1 tablet by mouth 2 (two) times daily.    Marland Kitchen triamterene-hydrochlorothiazide  (DYAZIDE) 37.5-25 MG capsule Take 1 capsule by mouth daily.    Marland Kitchen acetaminophen (TYLENOL) 500 MG tablet Take 1,000 mg by mouth every 6 (six) hours as needed for mild pain or moderate pain.  (Patient not taking: Reported on 02/04/2020)    . dicyclomine (BENTYL) 10 MG capsule Take 1 capsule (10 mg total) by mouth 3 (three) times daily before meals. (Patient not taking: Reported on 02/04/2020) 30 capsule 0  . enoxaparin (LOVENOX) 40 MG/0.4ML injection Inject 0.4 mLs into the skin.    . fenofibrate (TRICOR) 145 MG tablet Take 145 mg by mouth daily. (Patient not taking: Reported on 02/04/2020)    . metoCLOPramide (REGLAN) 10 MG tablet Take 10 mg by mouth every 6 (six) hours. (Patient not taking: Reported on 02/04/2020)    . Nicotine Polacrilex (COMMIT MT) Use as directed 1 lozenge in the mouth or throat daily as needed (for cessation). (Patient not taking: Reported on 02/04/2020)    . simvastatin (ZOCOR) 20 MG tablet Take 20 mg by mouth daily. (Patient not taking: Reported on 02/04/2020)     No current facility-administered medications on file prior to visit.    Social History   Socioeconomic History  . Marital status: Married    Spouse name: Not on file  . Number of children: 1  . Years of education: Not on file  . Highest education level: Not on file  Occupational History  .  Occupation: housewife  Tobacco Use  . Smoking status: Current Every Day Smoker    Packs/day: 0.50    Years: 44.00    Pack years: 22.00    Types: Cigarettes  . Smokeless tobacco: Never Used  . Tobacco comment: now down to less than 1/2 a pack  Vaping Use  . Vaping Use: Never used  Substance and Sexual Activity  . Alcohol use: Not Currently    Alcohol/week: 2.0 standard drinks    Types: 2 Glasses of wine per week  . Drug use: No  . Sexual activity: Not on file  Other Topics Concern  . Not on file  Social History Narrative  . Not on file   Social Determinants of Health   Financial Resource Strain:   . Difficulty of  Paying Living Expenses:   Food Insecurity:   . Worried About Charity fundraiser in the Last Year:   . Arboriculturist in the Last Year:   Transportation Needs:   . Film/video editor (Medical):   Marland Kitchen Lack of Transportation (Non-Medical):   Physical Activity:   . Days of Exercise per Week:   . Minutes of Exercise per Session:   Stress:   . Feeling of Stress :   Social Connections:   . Frequency of Communication with Friends and Family:   . Frequency of Social Gatherings with Friends and Family:   . Attends Religious Services:   . Active Member of Clubs or Organizations:   . Attends Archivist Meetings:   Marland Kitchen Marital Status:   Intimate Partner Violence:   . Fear of Current or Ex-Partner:   . Emotionally Abused:   Marland Kitchen Physically Abused:   . Sexually Abused:     Family History  Problem Relation Age of Onset  . Colon cancer Neg Hx     BP 123/79   Pulse (!) 109   Ht 5\' 5"  (1.651 m)   Wt 229 lb (103.9 kg)   BMI 38.11 kg/m   Body mass index is 38.11 kg/m.     Objective:   Physical Exam Vitals and nursing note reviewed.  Constitutional:      Appearance: She is well-developed.  HENT:     Head: Normocephalic and atraumatic.  Eyes:     Conjunctiva/sclera: Conjunctivae normal.     Pupils: Pupils are equal, round, and reactive to light.  Cardiovascular:     Rate and Rhythm: Normal rate and regular rhythm.  Pulmonary:     Effort: Pulmonary effort is normal.  Abdominal:     Palpations: Abdomen is soft.  Musculoskeletal:       Arms:     Cervical back: Normal range of motion and neck supple.  Skin:    General: Skin is warm and dry.  Neurological:     Mental Status: She is alert and oriented to person, place, and time.     Cranial Nerves: No cranial nerve deficit.     Motor: No abnormal muscle tone.     Coordination: Coordination normal.     Deep Tendon Reflexes: Reflexes are normal and symmetric. Reflexes normal.  Psychiatric:        Behavior: Behavior  normal.        Thought Content: Thought content normal.        Judgment: Judgment normal.           Assessment & Plan:   Encounter Diagnosis  Name Primary?  . Infection of left olecranon bursa Yes  I have aspirated the left olecranon bursa done by sterile technique and tolerated well.  About 4 to 5 cc of yellow pus material was aspirated.  I will send for culture and sensitivity and cell count.  Continue the Septra DS.  Go to ER this weekend if the redness gets worse, develop fever or chills or just know that something is not right.  Return on Tuesday.  Call if any problem.  Precautions discussed.   Electronically Signed Sanjuana Kava, MD 7/1/20219:01 AM

## 2020-02-04 NOTE — Patient Instructions (Signed)
Use slightly warm compresses to the elbow  Continue the antibiotics

## 2020-02-07 LAB — WOUND CULTURE
MICRO NUMBER:: 10657870
RESULT:: NO GROWTH
SPECIMEN QUALITY:: ADEQUATE

## 2020-02-09 ENCOUNTER — Other Ambulatory Visit: Payer: Self-pay

## 2020-02-09 ENCOUNTER — Encounter: Payer: Self-pay | Admitting: Orthopaedic Surgery

## 2020-02-09 ENCOUNTER — Ambulatory Visit (INDEPENDENT_AMBULATORY_CARE_PROVIDER_SITE_OTHER): Payer: Medicare Other | Admitting: Orthopaedic Surgery

## 2020-02-09 VITALS — BP 138/82 | HR 108 | Ht 65.0 in | Wt 230.2 lb

## 2020-02-09 DIAGNOSIS — M71122 Other infective bursitis, left elbow: Secondary | ICD-10-CM | POA: Diagnosis not present

## 2020-02-09 NOTE — Progress Notes (Signed)
Patient Sierra Roberts, female DOB:[birth year], 69 y.o. GMW:[id number]  Chief Complaint  Patient presents with  . Elbow Pain    some pain,still red and swollen.    HPI  Sierra Roberts is a 69 y.o. female who has infection of the left olecranon.  Cultures were negative. She had pus in the fluid.  She is out of Septra DS.  I will change to doxycycline 100 po bid for one week.  Samples have been given.  She has no drainage.  She has no pain.   Body mass index is 38.32 kg/m.  ROS  Review of Systems  Constitutional: Positive for activity change.  Musculoskeletal: Positive for arthralgias and joint swelling.  All other systems reviewed and are negative.   All other systems reviewed and are negative.  The following is a summary of the past history medically, past history surgically, known current medicines, social history and family history.  This information is gathered electronically by the computer from prior information and documentation.  I review this each visit and have found including this information at this point in the chart is beneficial and informative.    Past Medical History:  Diagnosis Date  . C. difficile diarrhea   . Cancer [facility])    left breast cancer  . High cholesterol   . Hypertension     Past Surgical History:  Procedure Laterality Date  . ABDOMINAL HYSTERECTOMY    . CHOLECYSTECTOMY    . COLONOSCOPY N/A 12/30/2012   Procedure: COLONOSCOPY;  Surgeon: Daneil Dolin, MD;  [facility];  Service: Endoscopy;  Laterality: N/A;  8:30-rescheduled to 9:30 Darius Bump notified pt  . DILATION AND CURETTAGE OF UTERUS     to remove IUD    Family History  Problem Relation Age of Onset  . Colon cancer Neg Hx     Social History Social History   Tobacco Use  . Smoking status: Current Every Day Smoker    Packs/day: 0.50    Years: 44.00    Pack years: 22.00    Types: Cigarettes  . Smokeless tobacco: Never Used  . Tobacco comment: now down to less than  1/2 a pack  Vaping Use  . Vaping Use: Never used  Substance Use Topics  . Alcohol use: Not Currently    Alcohol/week: 2.0 standard drinks    Types: 2 Glasses of wine per week  . Drug use: No    Allergies  Allergen Reactions  . Clams [Shellfish Allergy] Diarrhea and Nausea And Vomiting  . Flagyl [Metronidazole] Other (See Comments)    Numbness     Current Outpatient Medications  Medication Sig Dispense Refill  . sulfamethoxazole-trimethoprim (BACTRIM DS) 800-160 MG tablet Take 1 tablet by mouth 2 (two) times daily.    Marland Kitchen triamterene-hydrochlorothiazide (DYAZIDE) 37.5-25 MG capsule Take 1 capsule by mouth daily.     No current facility-administered medications for this visit.     Physical Exam  Blood pressure 138/82, pulse (!) 108, height 5\' 5"  (1.651 m), weight 230 lb 4 oz (104.4 kg).  Constitutional: overall normal hygiene, normal nutrition, well developed, normal grooming, normal body habitus. Assistive device:none  Musculoskeletal: gait and station Limp none, muscle tone and strength are normal, no tremors or atrophy is present.  .  Neurological: coordination overall normal.  Deep tendon reflex/nerve stretch intact.  Sensation normal.  Cranial nerves II-XII intact.   Skin:   Normal overall no scars, lesions, ulcers or rashes. No psoriasis.  Psychiatric: Alert and oriented  x 3.  Recent memory intact, remote memory unclear.  Normal mood and affect. Well groomed.  Good eye contact.  Cardiovascular: overall no swelling, no varicosities, no edema bilaterally, normal temperatures of the legs and arms, no clubbing, cyanosis and good capillary refill.  Lymphatic: palpation is normal.  The left elbow olecranon bursa is swollen but not as much as last time.  She has a burgundy color, not the bright red she had last week.  She says it looks the same but I disagree. ROM is full.  She has no pain, no fever or chills.    All other systems reviewed and are negative   The patient  has been educated about the nature of the problem(s) and counseled on treatment options.  The patient appeared to understand what I have discussed and is in agreement with it.  Encounter Diagnosis  Name Primary?  . Infection of left olecranon bursa Yes    PLAN Call if any problems.  Precautions discussed.  Begin the medicine I have given samples of.  Return to clinic 1 week   Electronically Signed Sanjuana Kava, MD 7/6/20212:28 PM

## 2020-02-09 NOTE — Patient Instructions (Signed)

## 2020-02-16 ENCOUNTER — Encounter: Payer: Self-pay | Admitting: Orthopaedic Surgery

## 2020-02-16 ENCOUNTER — Other Ambulatory Visit: Payer: Self-pay

## 2020-02-16 ENCOUNTER — Ambulatory Visit (INDEPENDENT_AMBULATORY_CARE_PROVIDER_SITE_OTHER): Payer: Medicare Other | Admitting: Orthopaedic Surgery

## 2020-02-16 VITALS — BP 122/79 | HR 98 | Ht 65.0 in | Wt 225.1 lb

## 2020-02-16 DIAGNOSIS — M71122 Other infective bursitis, left elbow: Secondary | ICD-10-CM | POA: Diagnosis not present

## 2020-02-16 MED ORDER — SULFAMETHOXAZOLE-TRIMETHOPRIM 800-160 MG PO TABS: 1.0000 | ORAL_TABLET | Freq: Two times a day (BID) | ORAL | 1 refills | Status: AC

## 2020-02-16 NOTE — Progress Notes (Signed)
Patient ST:Sierra Roberts, female DOB:Mar 19, 1951, 69 y.o. QIW:[id number]  Chief Complaint  Patient presents with  . Elbow Pain    L/ still sore, red and puffy. Hurting and stinging some today    HPI  Sierra Roberts is a 69 y.o. female who has had infection of the left olecranon bursa area. She has been on doxycyline this past week.  Previously she had been on Septra DS.  She has more swelling today and some slight redness.  She has no pain.  She has been treated for cancer in the last 18 months.  She is at increased risk secondary to immuno compromise.  I have recommended PICC line and IV vancomycin.  She declines.  She says she is moving to [place] next Wednesday and cannot go to hospital for visits for IV treatments.  I have strongly recommended this.  She wants to take oral medicine and treat as she has been doing and see doctor in the [place] area of [place] after she moves.    I will give her the oral Septra DS.  I will see her next week before she leaves the area.  I have asked that she contact her family in [place] and make arrangements to be seen there after she arrives.  I have asked her to go to ER if she gets worse or call me.    Body mass index is 37.46 kg/m.  ROS  Review of Systems  Constitutional: Positive for activity change.  Musculoskeletal: Positive for arthralgias and joint swelling.  All other systems reviewed and are negative.   All other systems reviewed and are negative.  The following is a summary of the past history medically, past history surgically, known current medicines, social history and family history.  This information is gathered electronically by the computer from prior information and documentation.  I review this each visit and have found including this information at this point in the chart is beneficial and informative.    Past Medical History:  Diagnosis Date  . C. difficile diarrhea   . Cancer [facility])    left breast cancer  .  High cholesterol   . Hypertension     Past Surgical History:  Procedure Laterality Date  . ABDOMINAL HYSTERECTOMY    . CHOLECYSTECTOMY    . COLONOSCOPY N/A 12/30/2012   Procedure: COLONOSCOPY;  Surgeon: Daneil Dolin, MD;  [facility];  Service: Endoscopy;  Laterality: N/A;  8:30-rescheduled to 9:30 Darius Bump notified pt  . DILATION AND CURETTAGE OF UTERUS     to remove IUD    Family History  Problem Relation Age of Onset  . Colon cancer Neg Hx     Social History Social History   Tobacco Use  . Smoking status: Current Every Day Smoker    Packs/day: 0.50    Years: 44.00    Pack years: 22.00    Types: Cigarettes  . Smokeless tobacco: Never Used  . Tobacco comment: now down to less than 1/2 a pack  Vaping Use  . Vaping Use: Never used  Substance Use Topics  . Alcohol use: Not Currently    Alcohol/week: 2.0 standard drinks    Types: 2 Glasses of wine per week  . Drug use: No    Allergies  Allergen Reactions  . Clams [Shellfish Allergy] Diarrhea and Nausea And Vomiting  . Flagyl [Metronidazole] Other (See Comments)    Numbness     Current Outpatient Medications  Medication Sig Dispense Refill  .  sulfamethoxazole-trimethoprim (BACTRIM DS) 800-160 MG tablet Take 1 tablet by mouth 2 (two) times daily. 28 tablet 1  . triamterene-hydrochlorothiazide (DYAZIDE) 37.5-25 MG capsule Take 1 capsule by mouth daily.     No current facility-administered medications for this visit.     Physical Exam  Blood pressure 122/79, pulse 98, height 5\' 5"  (1.651 m), weight 225 lb 2 oz (102.1 kg).  Constitutional: overall normal hygiene, normal nutrition, well developed, normal grooming, normal body habitus. Assistive device:none  Musculoskeletal: gait and station Limp none, muscle tone and strength are normal, no tremors or atrophy is present.  .  Neurological: coordination overall normal.  Deep tendon reflex/nerve stretch intact.  Sensation normal.  Cranial nerves  II-XII intact.   Skin:   Normal overall no scars, lesions, ulcers or rashes. No psoriasis.  Psychiatric: Alert and oriented x 3.  Recent memory intact, remote memory unclear.  Normal mood and affect. Well groomed.  Good eye contact.  Cardiovascular: overall no swelling, no varicosities, no edema bilaterally, normal temperatures of the legs and arms, no clubbing, cyanosis and good capillary refill.  Lymphatic: palpation is normal.  Left olecranon has swelling and bogginess to the olecranon.  I attempted to aspirate fluid but could not obtain any other than some blood.  She has some redness.  NV intact. She has full ROM of the elbow and no fever.  All other systems reviewed and are negative   The patient has been educated about the nature of the problem(s) and counseled on treatment options.  The patient appeared to understand what I have discussed and is in agreement with it.  Encounter Diagnosis  Name Primary?  . Infection of left olecranon bursa Yes   Procedure note: After permission from the patient and prep, I aspirated the bursa of only a little blood, no pus.  She tolerated it well.  It was done by sterile technique.  PLAN Call if any problems.  Precautions discussed.  Continue current medications. I have called in Septra DS.   Return to clinic 1 week   Call if any problem, any question.  Electronically Signed Sanjuana Kava, MD 7/13/202111:20 AM

## 2020-02-16 NOTE — Patient Instructions (Signed)

## 2020-02-23 ENCOUNTER — Other Ambulatory Visit: Payer: Self-pay

## 2020-02-23 ENCOUNTER — Ambulatory Visit (INDEPENDENT_AMBULATORY_CARE_PROVIDER_SITE_OTHER): Payer: Medicare Other | Admitting: Orthopaedic Surgery

## 2020-02-23 ENCOUNTER — Encounter: Payer: Self-pay | Admitting: Orthopaedic Surgery

## 2020-02-23 VITALS — BP 139/79 | HR 98 | Ht 65.0 in | Wt 225.0 lb

## 2020-02-23 DIAGNOSIS — M71122 Other infective bursitis, left elbow: Secondary | ICD-10-CM

## 2020-02-23 NOTE — Progress Notes (Signed)
[name], female DOB:March 14, 1951, 69 y.o. VVO:[id number]  Chief Complaint  Patient presents with   Elbow Problem    left elbow / olecranon bursa infection/ improving     HPI  Sierra Roberts is a 69 y.o. female who has infected left olecranon bursa on the left. She is better. She has much less redness. She has no pain.  She is moving to [place] tomorrow.  We have gone over precautions.  She has arranged to see doctor there once she gets there.  She has enough antibiotics for another week.  She has no fever or chills.  She is to call here if she has a problem.  I can send notes or talk to her doctor in [place] if needed.   Body mass index is 37.44 kg/m.  ROS  Review of Systems  Constitutional: Positive for activity change.  Musculoskeletal: Positive for arthralgias and joint swelling.  All other systems reviewed and are negative.   All other systems reviewed and are negative.  The following is a summary of the past history medically, past history surgically, known current medicines, social history and family history.  This information is gathered electronically by the computer from prior information and documentation.  I review this each visit and have found including this information at this point in the chart is beneficial and informative.    Past Medical History:  Diagnosis Date   C. difficile diarrhea    Cancer ([place])    left breast cancer   High cholesterol    Hypertension     Past Surgical History:  Procedure Laterality Date   ABDOMINAL HYSTERECTOMY     CHOLECYSTECTOMY     COLONOSCOPY N/A 12/30/2012   Procedure: COLONOSCOPY;  Surgeon: Daneil Dolin, MD;  [facility];  Service: Endoscopy;  Laterality: N/A;  8:30-rescheduled to 9:30 Darius Bump notified pt   DILATION AND CURETTAGE OF UTERUS     to remove IUD    Family History  Problem Relation Age of Onset   Colon cancer Neg Hx     Social History Social History   Tobacco Use    Smoking status: Current Every Day Smoker    Packs/day: 0.50    Years: 44.00    Pack years: 22.00    Types: Cigarettes   Smokeless tobacco: Never Used   Tobacco comment: now down to less than 1/2 a pack  Vaping Use   Vaping Use: Never used  Substance Use Topics   Alcohol use: Not Currently    Alcohol/week: 2.0 standard drinks    Types: 2 Glasses of wine per week   Drug use: No    Allergies  Allergen Reactions   Paclitaxel Nausea And Vomiting and Shortness Of Breath    Patient tolerates Abraxane.   Clams [employer] Allergy] Diarrhea and Nausea And Vomiting   Flagyl [Metronidazole] Other (See Comments)    Numbness     Current Outpatient Medications  Medication Sig Dispense Refill   sulfamethoxazole-trimethoprim (BACTRIM DS) 800-160 MG tablet Take 1 tablet by mouth 2 (two) times daily. 28 tablet 1   triamterene-hydrochlorothiazide (DYAZIDE) 37.5-25 MG capsule Take 1 capsule by mouth daily.     No current facility-administered medications for this visit.     Physical Exam  Blood pressure 139/79, pulse 98, height 5\' 5"  (1.651 m), weight 225 lb (102.1 kg).  Constitutional: overall normal hygiene, normal nutrition, well developed, normal grooming, normal body habitus. Assistive device:none  Musculoskeletal: gait and station Limp none, muscle  tone and strength are normal, no tremors or atrophy is present.  .  Neurological: coordination overall normal.  Deep tendon reflex/nerve stretch intact.  Sensation normal.  Cranial nerves II-XII intact.   Skin:   Normal overall no scars, lesions, ulcers or rashes. No psoriasis.  Psychiatric: Alert and oriented x 3.  Recent memory intact, remote memory unclear.  Normal mood and affect. Well groomed.  Good eye contact.  Cardiovascular: overall no swelling, no varicosities, no edema bilaterally, normal temperatures of the legs and arms, no clubbing, cyanosis and good capillary refill.  Lymphatic: palpation is normal.  The  left olecranon bursa has swelling but the redness is much improved and she has no pain and full extension.  NV intact.  All other systems reviewed and are negative   The patient has been educated about the nature of the problem(s) and counseled on treatment options.  The patient appeared to understand what I have discussed and is in agreement with it.  Encounter Diagnosis  Name Primary?   Infection of left olecranon bursa Yes    PLAN Call if any problems.  Precautions discussed.  Continue current medications.   Return to clinic she is moving to [place] tomorrow.  to see doctor there.    Electronically Signed Sanjuana Kava, MD 7/20/202110:42 AM

## 2020-03-27 IMAGING — CT CT ABD-PELV W/ CM
2 of 5 series · 16 of 46 positions shown, 18 images · IV contrast (Isovue)
Comparison: 10/28/2007 CT abdomen/pelvis.

CLINICAL DATA: Left lower abdominal pain for 1 week. History of
cholecystectomy.

EXAM:
CT ABDOMEN AND PELVIS WITH CONTRAST
TECHNIQUE: Multidetector CT imaging of the abdomen and pelvis was performed
using the standard protocol following bolus administration of
intravenous contrast.
CONTRAST:  100mL OMNIPAQUE IOHEXOL 300 MG/ML  SOLN

[Series 2: axial st · axial · 0.83mm/px · z∈[+836,+1241]mm · 13 of 93 slices shown, 15 images]
[im 6/93  soft-tissue]
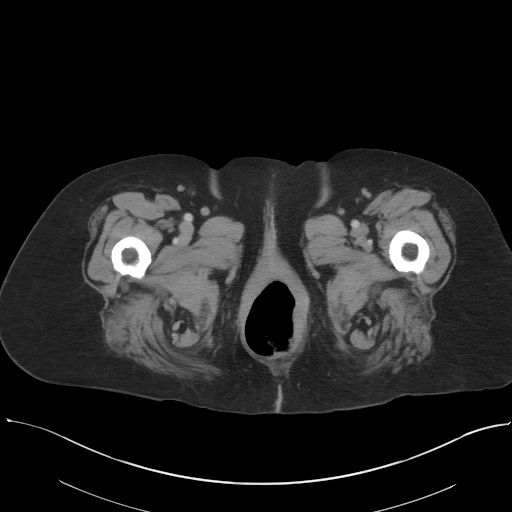
[im 6/93  bone]
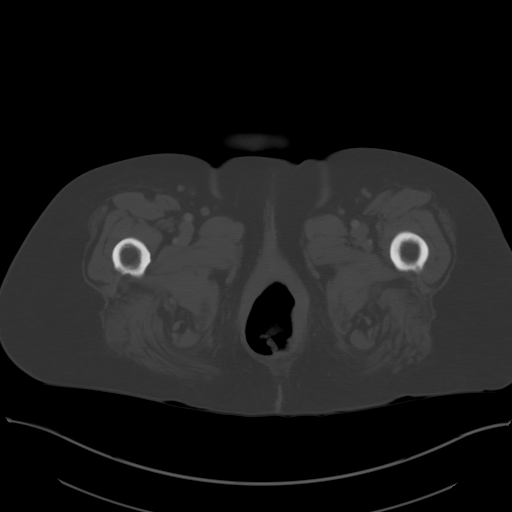
[im 11/93  soft-tissue]
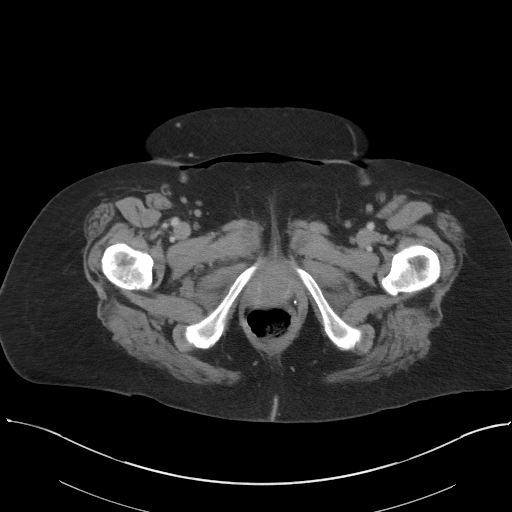
[im 22/93  soft-tissue]
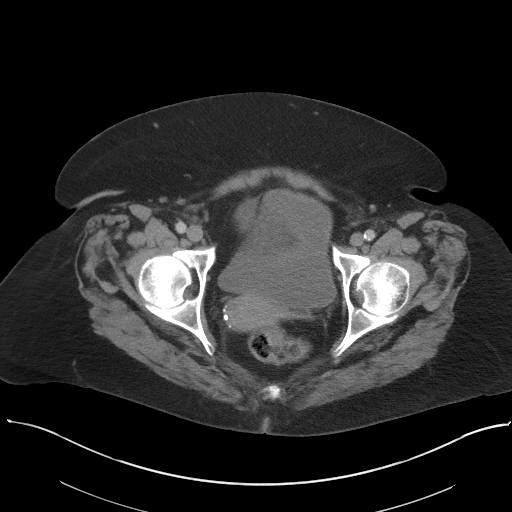
[im 28/93  soft-tissue]
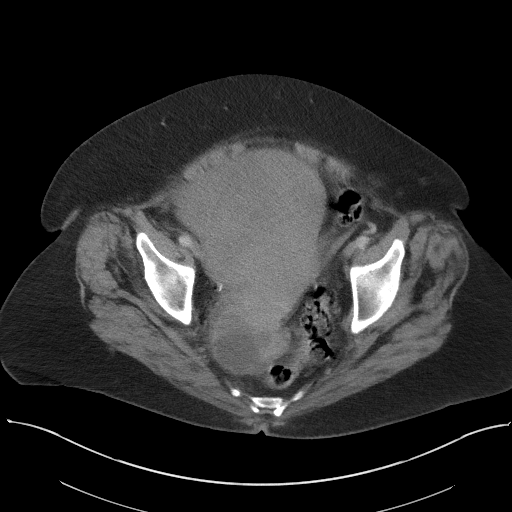
[im 33/93  soft-tissue]
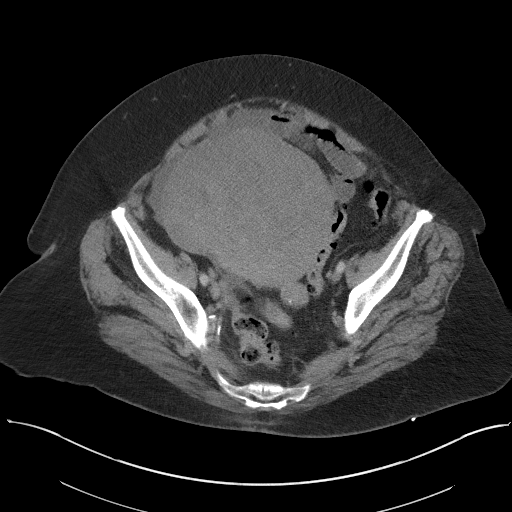
[im 38/93  soft-tissue]
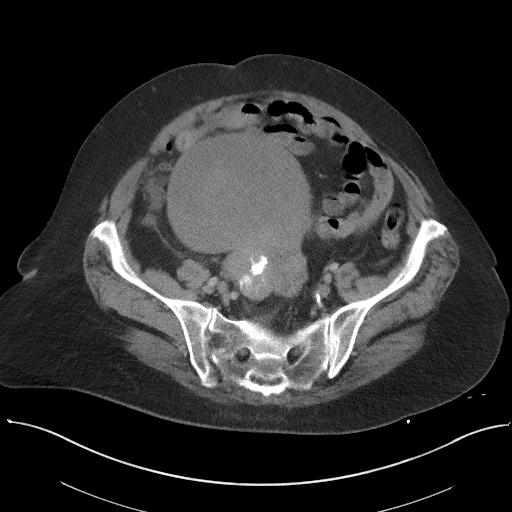
[im 49/93  soft-tissue]
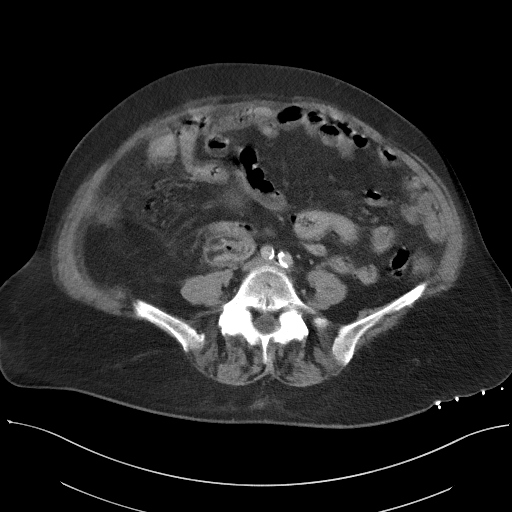
[im 55/93  soft-tissue]
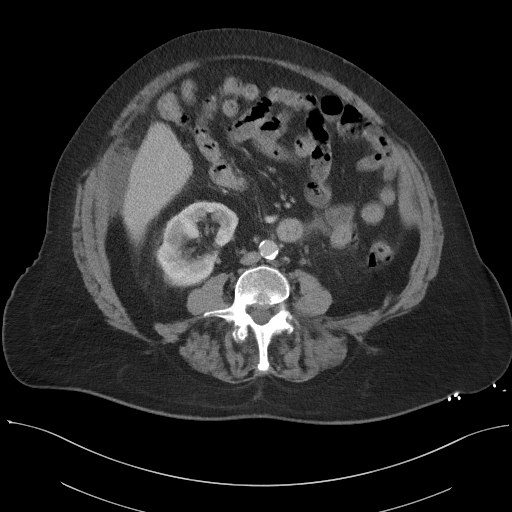
[im 60/93  soft-tissue]
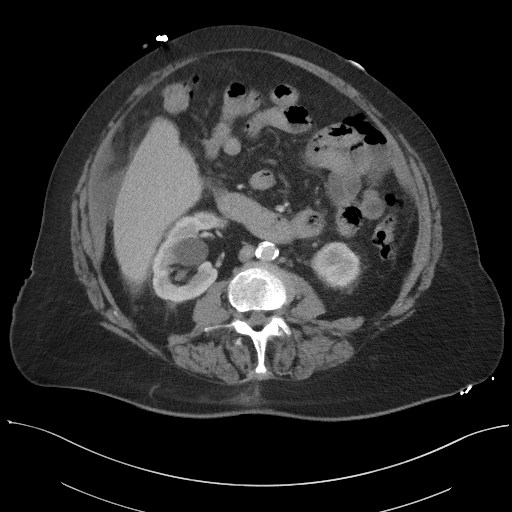
[im 60/93  bone]
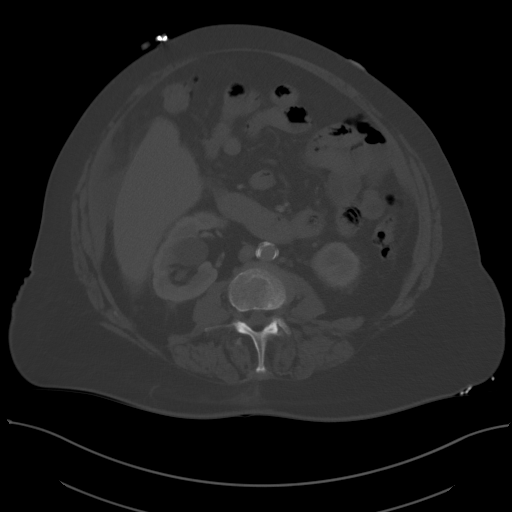
[im 65/93  soft-tissue]
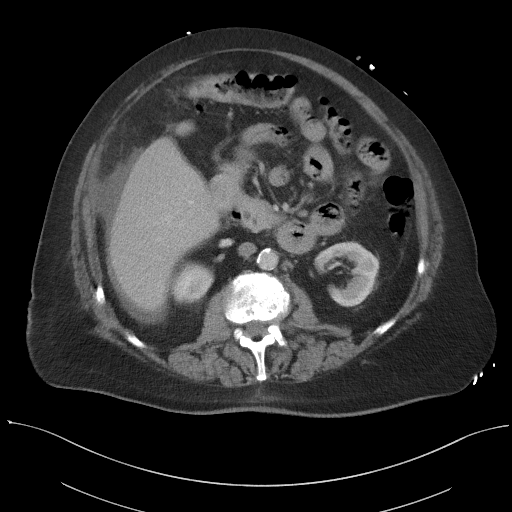
[im 71/93  soft-tissue]
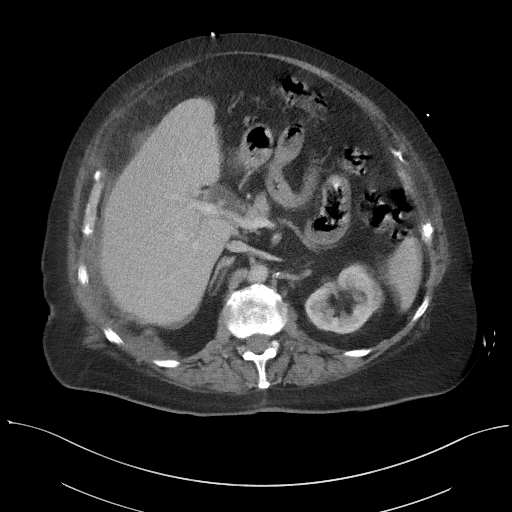
[im 82/93  soft-tissue]
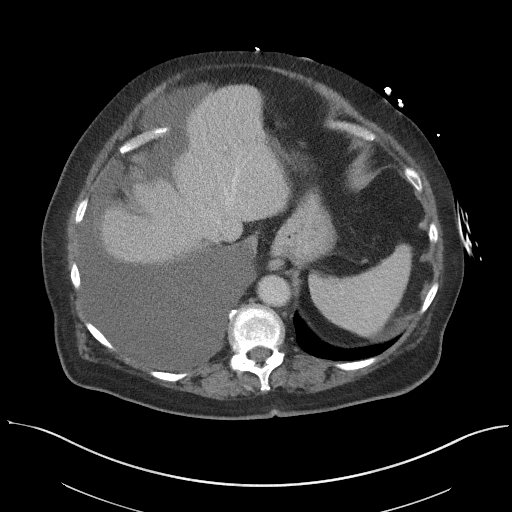
[im 87/93  soft-tissue]
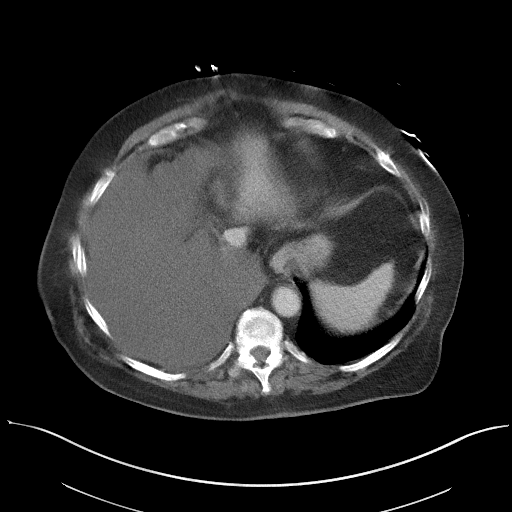

[Series 6: coronal st · coronal · 0.83mm/px · 3 of 117 slices shown]
[im 39/117  soft-tissue]
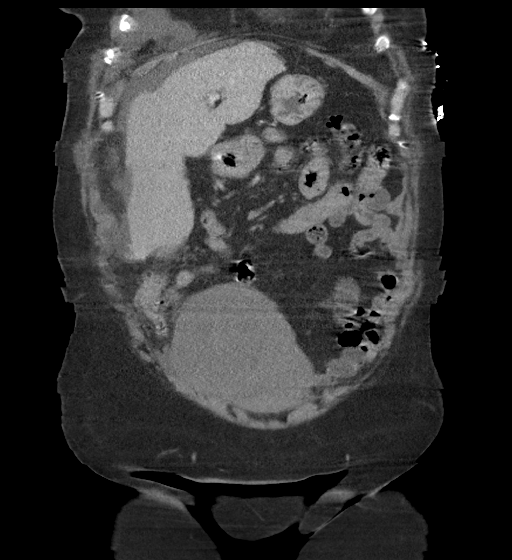
[im 52/117  soft-tissue]
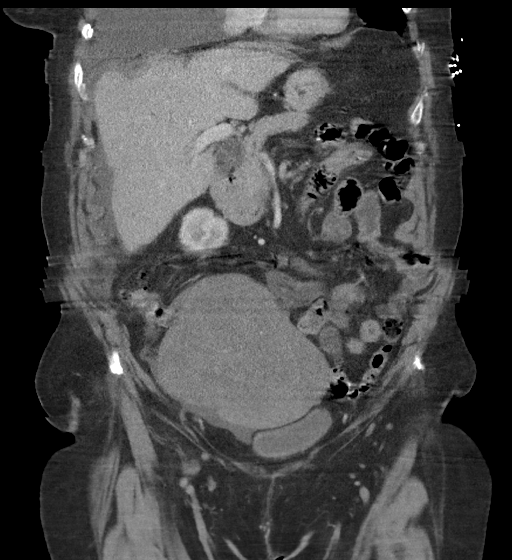
[im 65/117  soft-tissue]
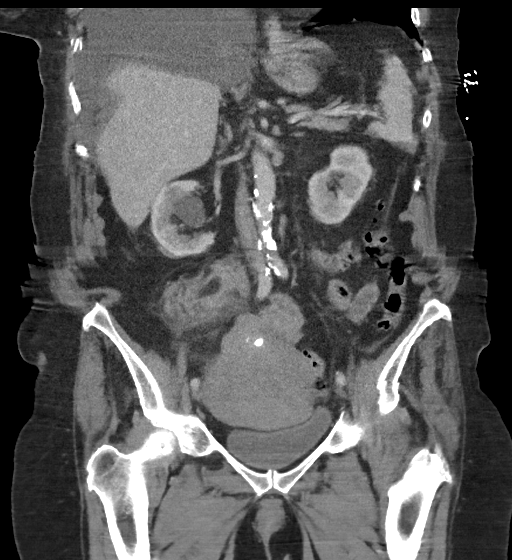

[16 of 46 positions shown; findings below may reference images not displayed]

FINDINGS: Motion degraded scan, limiting assessment.

Lower chest: Large right pleural effusion.

Hepatobiliary: Normal liver size. No liver mass. Cholecystectomy.
Bile ducts are within normal post cholecystectomy limits.

Pancreas: Normal, with no mass or duct dilation.

Spleen: Normal size. No mass.

Adrenals/Urinary Tract: Normal adrenals. No hydronephrosis. Simple
right renal cysts, largest 2.5 cm. Additional subcentimeter
hypodense renal cortical lesions in the right kidney are too small
to characterize and require no follow-up. Normal bladder.

Stomach/Bowel: Normal non-distended stomach. Normal caliber small
bowel with no small bowel wall thickening. Normal appendix. Moderate
diffuse colonic diverticulosis. There is mild wall thickening
throughout the right colon with haziness of the right pericolonic
fat.

Vascular/Lymphatic: Atherosclerotic nonaneurysmal abdominal aorta.
Patent portal, splenic, hepatic and renal veins. No pathologically
enlarged lymph nodes in the abdomen or pelvis.

Reproductive: There is a complex cystic 5.4 x 4.5 cm right adnexal
mass (series 2/image 66). There is a markedly enlarged uterus with
dominant 14.6 cm exophytic anterior uterine mass with coarse
posterior internal calcifications, which is essentially new since
7554.

Other: Small volume ascites. No focal fluid collection. No
pneumoperitoneum.

Musculoskeletal: No aggressive appearing focal osseous lesions.
Marked thoracolumbar spondylosis.
IMPRESSION: 1. Markedly enlarged uterus with dominant 14.6 cm exophytic anterior
uterine mass, which is essentially new since 7554 CT. Differential
includes bulky uterine fibroid versus leiomyosarcoma. Gyn
consultation suggested.
2. Complex cystic 5.4 cm right adnexal mass, right ovarian neoplasm
not excluded.
3. MRI pelvis without and with IV contrast may be considered for
further characterization of these gynecologic masses.
4. Mild wall thickening throughout the right colon with haziness of
the pericolonic fat, which could be due to noninflammatory edema
versus nonspecific mild colitis. Although there is moderate diffuse
colonic diverticulosis, these findings are not favored represent
acute diverticulitis.
5. Small volume ascites.
6. Large right pleural effusion.
7.  Aortic Atherosclerosis ([id number]).

## 2020-05-22 DIAGNOSIS — Z23 Encounter for immunization: Secondary | ICD-10-CM | POA: Diagnosis not present

## 2020-06-22 DIAGNOSIS — Z23 Encounter for immunization: Secondary | ICD-10-CM | POA: Diagnosis not present
